# Patient Record
Sex: Male | Born: 1970
Health system: Southern US, Community
[De-identification: ages and names within clinical notes are randomized; demographics above are authoritative.]

## PROBLEM LIST (undated history)

## (undated) DIAGNOSIS — S2231XA Fracture of one rib, right side, initial encounter for closed fracture: Secondary | ICD-10-CM

## (undated) DIAGNOSIS — F419 Anxiety disorder, unspecified: Secondary | ICD-10-CM

## (undated) DIAGNOSIS — G4700 Insomnia, unspecified: Secondary | ICD-10-CM

## (undated) DIAGNOSIS — J309 Allergic rhinitis, unspecified: Secondary | ICD-10-CM

## (undated) DIAGNOSIS — M5430 Sciatica, unspecified side: Secondary | ICD-10-CM

## (undated) DIAGNOSIS — S2241XA Multiple fractures of ribs, right side, initial encounter for closed fracture: Secondary | ICD-10-CM

## (undated) HISTORY — DX: Anxiety disorder, unspecified: F41.9

## (undated) HISTORY — DX: Allergic rhinitis, unspecified: J30.9

## (undated) HISTORY — DX: Insomnia, unspecified: G47.00

## (undated) HISTORY — DX: Multiple fractures of ribs, right side, initial encounter for closed fracture: S22.41XA

## (undated) HISTORY — DX: Fracture of one rib, right side, initial encounter for closed fracture: S22.31XA

## (undated) HISTORY — DX: Sciatica, unspecified side: M54.30

---

## 2007-10-26 DIAGNOSIS — G44209 Tension-type headache, unspecified, not intractable: Secondary | ICD-10-CM | POA: Insufficient documentation

## 2009-01-09 ENCOUNTER — Ambulatory Visit: Payer: Self-pay | Admitting: Family Medicine

## 2009-11-07 ENCOUNTER — Ambulatory Visit: Payer: Self-pay | Admitting: Family Medicine

## 2012-09-24 ENCOUNTER — Ambulatory Visit: Payer: Self-pay | Admitting: Family Medicine

## 2013-01-08 ENCOUNTER — Ambulatory Visit: Payer: Self-pay | Admitting: Unknown Physician Specialty

## 2013-01-08 LAB — HM COLONOSCOPY

## 2013-10-10 ENCOUNTER — Emergency Department: Payer: Self-pay | Admitting: Emergency Medicine

## 2014-05-05 ENCOUNTER — Ambulatory Visit: Payer: Self-pay | Admitting: Family Medicine

## 2014-06-05 ENCOUNTER — Emergency Department: Payer: Self-pay | Admitting: Emergency Medicine

## 2014-06-07 ENCOUNTER — Ambulatory Visit: Payer: Self-pay | Admitting: Family Medicine

## 2014-10-14 ENCOUNTER — Encounter: Payer: Self-pay | Admitting: Family Medicine

## 2014-10-14 ENCOUNTER — Encounter: Payer: Self-pay | Admitting: *Deleted

## 2014-10-14 ENCOUNTER — Ambulatory Visit (INDEPENDENT_AMBULATORY_CARE_PROVIDER_SITE_OTHER): Payer: PRIVATE HEALTH INSURANCE | Admitting: Family Medicine

## 2014-10-14 VITALS — BP 142/70 | HR 96 | Temp 98.4°F | Resp 16 | Ht 72.0 in | Wt 206.0 lb

## 2014-10-14 DIAGNOSIS — G8929 Other chronic pain: Secondary | ICD-10-CM | POA: Insufficient documentation

## 2014-10-14 DIAGNOSIS — S2241XA Multiple fractures of ribs, right side, initial encounter for closed fracture: Secondary | ICD-10-CM

## 2014-10-14 DIAGNOSIS — M79671 Pain in right foot: Secondary | ICD-10-CM | POA: Insufficient documentation

## 2014-10-14 DIAGNOSIS — K529 Noninfective gastroenteritis and colitis, unspecified: Secondary | ICD-10-CM | POA: Insufficient documentation

## 2014-10-14 DIAGNOSIS — S2231XA Fracture of one rib, right side, initial encounter for closed fracture: Secondary | ICD-10-CM | POA: Insufficient documentation

## 2014-10-14 DIAGNOSIS — H6690 Otitis media, unspecified, unspecified ear: Secondary | ICD-10-CM | POA: Insufficient documentation

## 2014-10-14 DIAGNOSIS — H6692 Otitis media, unspecified, left ear: Secondary | ICD-10-CM | POA: Diagnosis not present

## 2014-10-14 DIAGNOSIS — M5430 Sciatica, unspecified side: Secondary | ICD-10-CM | POA: Insufficient documentation

## 2014-10-14 DIAGNOSIS — R0781 Pleurodynia: Secondary | ICD-10-CM | POA: Insufficient documentation

## 2014-10-14 DIAGNOSIS — M25559 Pain in unspecified hip: Secondary | ICD-10-CM

## 2014-10-14 HISTORY — DX: Multiple fractures of ribs, right side, initial encounter for closed fracture: S22.41XA

## 2014-10-14 HISTORY — DX: Sciatica, unspecified side: M54.30

## 2014-10-14 HISTORY — DX: Fracture of one rib, right side, initial encounter for closed fracture: S22.31XA

## 2014-10-14 MED ORDER — AMOXICILLIN 500 MG PO CAPS: 1000.0000 mg | ORAL_CAPSULE | Freq: Two times a day (BID) | ORAL | Status: AC

## 2014-10-14 MED ORDER — VALACYCLOVIR HCL 1 G PO TABS: 1000.0000 mg | ORAL_TABLET | Freq: Every day | ORAL | Status: DC

## 2014-10-14 NOTE — Progress Notes (Signed)
   Patient: Patrick Lucas Male    DOB: 1970/09/19   44 y.o.   MRN: [id number] Visit Date: 10/14/2014  Today's Provider: Mila Merry, MD   No chief complaint on file.  Subjective:    Headache  Associated symptoms include ear pain.  Otalgia  There is pain in both ears. Associated symptoms include headaches.       Previous Medications   ETODOLAC (LODINE) 500 MG TABLET    TAKE 1 TABLET (500 MG TOTAL) BY MOUTH 2 (TWO) TIMES DAILY.   LORAZEPAM (ATIVAN) 1 MG TABLET    Take by mouth.   TRAZODONE (DESYREL) 50 MG TABLET    TAKE 1-2 TABLET BY MOUTH AT BEDTIME AS NEEDED FOR INSOMNIA   VALACYCLOVIR (VALTREX) 1000 MG TABLET    Take by mouth.    Review of Systems  HENT: Positive for ear pain.   Neurological: Positive for headaches.    History  Substance Use Topics  . Smoking status: Current Every Day Smoker -- 1.00 packs/day for 30 years    Types: Cigarettes    Start date: 10/13/1984  . Smokeless tobacco: Not on file  . Alcohol Use: No   Objective:   There were no vitals taken for this visit.  Physical Exam      Assessment & Plan:     1. Acute left otitis media, recurrence not specified, unspecified otitis media type Amoxicillin 500 2 twice a day for 10 days.

## 2014-10-14 NOTE — Progress Notes (Signed)
Subjective:     Patient ID: Patrick Lucas, male   DOB: 1971/01/03, 44 y.o.   MRN: [id number]  Headache  This is a new problem. The current episode started in the past 7 days. The problem occurs constantly. The problem has been gradually worsening. The pain radiates to the right neck. The quality of the pain is described as aching. The pain is at a severity of 4/10. The pain is mild. Associated symptoms include ear pain and neck pain. Pertinent negatives include no coughing, fever, sinus pressure or sore throat. He has tried acetaminophen and NSAIDs for the symptoms. The treatment provided no relief. There is no history of migraine headaches.  Otalgia  There is pain in both (Left more than the right side) ears. This is a new problem. The current episode started in the past 7 days. The problem occurs constantly. The problem has been gradually worsening. There has been no fever. The pain is at a severity of 4/10. The pain is mild. Associated symptoms include headaches and neck pain. Pertinent negatives include no coughing or sore throat. He has tried nothing for the symptoms.     Review of Systems  Constitutional: Negative.  Negative for fever.  HENT: Positive for ear pain. Negative for sinus pressure and sore throat.   Eyes: Negative.   Respiratory: Negative for cough.   Musculoskeletal: Positive for neck pain.  Neurological: Positive for headaches.       Objective:   Physical Exam BP 142/70 mmHg  Pulse 96  Temp(Src) 98.4 F (36.9 C) (Oral)  Resp 16  Ht 6' (1.829 m)  Wt 206 lb (93.441 kg)  BMI 27.93 kg/m2  SpO2 98%   General Appearance:    Alert, cooperative, no distress  Eyes:    PERRL, conjunctiva/corneas clear, EOM's intact       Lungs:     Clear to auscultation bilaterally, respirations unlabored  Heart:    Regular rate and rhythm  Neurologic:   Awake, alert, oriented x 3. No apparent focal neurological           defect.   ENT:   Left TM inflamed. Mild OP clear drainage. Post  auricular LAD

## 2014-10-18 ENCOUNTER — Encounter: Payer: Self-pay | Admitting: Family Medicine

## 2014-11-21 ENCOUNTER — Encounter: Payer: Self-pay | Admitting: Family Medicine

## 2014-11-21 ENCOUNTER — Ambulatory Visit (INDEPENDENT_AMBULATORY_CARE_PROVIDER_SITE_OTHER): Payer: PRIVATE HEALTH INSURANCE | Admitting: Family Medicine

## 2014-11-21 ENCOUNTER — Ambulatory Visit
Admission: RE | Admit: 2014-11-21 | Discharge: 2014-11-21 | Disposition: A | Discharge status: Home / Self Care | Payer: 59 | Source: Ambulatory Visit | Attending: Family Medicine | Admitting: Family Medicine

## 2014-11-21 VITALS — BP 120/92 | HR 100 | Temp 97.7°F | Resp 16 | Wt 204.8 lb

## 2014-11-21 DIAGNOSIS — S62603A Fracture of unspecified phalanx of left middle finger, initial encounter for closed fracture: Secondary | ICD-10-CM | POA: Diagnosis not present

## 2014-11-21 DIAGNOSIS — X58XXXA Exposure to other specified factors, initial encounter: Secondary | ICD-10-CM | POA: Diagnosis not present

## 2014-11-21 DIAGNOSIS — M25561 Pain in right knee: Secondary | ICD-10-CM

## 2014-11-21 DIAGNOSIS — M79645 Pain in left finger(s): Secondary | ICD-10-CM | POA: Diagnosis not present

## 2014-11-21 DIAGNOSIS — T1490XA Injury, unspecified, initial encounter: Principal | ICD-10-CM

## 2014-11-21 DIAGNOSIS — M25461 Effusion, right knee: Secondary | ICD-10-CM | POA: Diagnosis not present

## 2014-11-21 MED ORDER — IBUPROFEN 800 MG PO TABS: 800.0000 mg | ORAL_TABLET | Freq: Three times a day (TID) | ORAL | Status: DC | PRN

## 2014-11-21 NOTE — Progress Notes (Signed)
Patient ID: Patrick Lucas, male   DOB: Apr 30, 1970, 44 y.o.   MRN: [id number]       Patient: Patrick Lucas Male    DOB: Sep 04, 1970   44 y.o.   MRN: [id number] Visit Date: 11/21/2014  Today's Provider: Dortha Kern, PA   Chief Complaint  Patient presents with  . Knee Pain    right knee, fell on Saturday evening, 11/19/2014, took some ibuprofen yesterday.  . Hand Pain    left hand swollen   Subjective:    HPI  This 44 year old male tripped over his Rotweiler dog and fell. Got tangled up in a chair as he fell and now having pain in the right knee Saturday 11-19-14. Notice swelling of the left ring finger today and had to cut his ring off.   Past Medical History  Diagnosis Date  . Anxiety   . Insomnia   . Allergic rhinitis   . Fracture of multiple ribs, right, closed. initial encounter 10/14/2014  . Fracture of one rib of right side, sequela 10/14/2014  . Sciatica 10/14/2014   No past surgical history on file. No family history on file.   No Known Allergies   Previous Medications   LORAZEPAM (ATIVAN) 1 MG TABLET    Take by mouth.   TRAZODONE (DESYREL) 50 MG TABLET    TAKE 1-2 TABLET BY MOUTH AT BEDTIME AS NEEDED FOR INSOMNIA   VALACYCLOVIR (VALTREX) 1000 MG TABLET    Take 1 tablet (1,000 mg total) by mouth daily.    Review of Systems  Constitutional: Negative.   HENT: Negative.   Respiratory: Negative.   Cardiovascular: Negative.   Gastrointestinal: Negative.   Musculoskeletal: Positive for arthralgias.       Right knee pain to cross legs or climb stairs. Pain in left ring finger with swelling.  Neurological: Negative.     History  Substance Use Topics  . Smoking status: Current Every Day Smoker -- 1.00 packs/day for 30 years    Types: Cigarettes    Start date: 10/13/1984  . Smokeless tobacco: Not on file  . Alcohol Use: No   Objective:   BP 120/92 mmHg  Pulse 100  Temp(Src) 97.7 F (36.5 C) (Oral)  Resp 16  Wt 204 lb 12.8 oz (92.897 kg)  Physical Exam    Constitutional: He is oriented to person, place, and time. He appears well-developed and well-nourished. No distress.  HENT:  Head: Normocephalic and atraumatic.  Right Ear: Hearing normal.  Left Ear: Hearing normal.  Nose: Nose normal.  Eyes: Conjunctivae and lids are normal. Right eye exhibits no discharge. Left eye exhibits no discharge. No scleral icterus.  Pulmonary/Chest: Effort normal. No respiratory distress.  Musculoskeletal: Normal range of motion. He exhibits tenderness.  Right medial knee. Worse to stress medial collateral ligament. Negative Drawer Sign. No click or locking. Swelling and tenderness of the left PIP joint with stiffness to test ROM.  Neurological: He is alert and oriented to person, place, and time.  Skin: Skin is intact. No lesion and no rash noted.  Psychiatric: He has a normal mood and affect. His speech is normal and behavior is normal. Thought content normal.      Assessment & Plan:     1. Pain of right knee after injury Onset 2 days ago when he tripped over a dog and fell onto a chair. Pain sharp with climbing stairs and putting medial stress on the joint. Will get x-ray evaluation and treat with knee brace and  Ibuprofen 800 mg TID. May continue ice pack prn. Recheck pernding - DG Knee Complete 4 Views Right  2. Pain in finger of left hand Onset with fall Saturday. Swelling of the PIP joint of the left ring finger. Will get x-ray evaluation and recheck pending report. - DG Hand Complete Left       Dortha Kern, PA  [facility]

## 2014-12-22 ENCOUNTER — Ambulatory Visit (INDEPENDENT_AMBULATORY_CARE_PROVIDER_SITE_OTHER): Payer: PRIVATE HEALTH INSURANCE | Admitting: Family Medicine

## 2014-12-22 ENCOUNTER — Encounter: Payer: Self-pay | Admitting: Family Medicine

## 2014-12-22 VITALS — BP 94/68 | HR 96 | Temp 98.0°F | Resp 16 | Wt 205.0 lb

## 2014-12-22 DIAGNOSIS — K529 Noninfective gastroenteritis and colitis, unspecified: Secondary | ICD-10-CM

## 2014-12-22 MED ORDER — PROMETHAZINE HCL 25 MG PO TABS: 25.0000 mg | ORAL_TABLET | Freq: Three times a day (TID) | ORAL | Status: DC | PRN

## 2014-12-22 NOTE — Progress Notes (Signed)
       Patient: Patrick Lucas Male    ESIAH BAZINET 1972   44 y.o.   MRN: [id number] Visit Date: 12/22/2014  Today's Provider: Mila Merry, MD   Chief Complaint  Patient presents with  . Diarrhea  . Nausea  . Emesis   Subjective:    Diarrhea  The current episode started yesterday. The problem occurs more than 10 times per day. The problem has been gradually improving. The patient states that diarrhea awakens him from sleep. Associated symptoms include abdominal pain, chills, a fever, sweats and vomiting. Pertinent negatives include no bloating, headaches or increased  flatus. Nothing aggravates the symptoms. Treatments tried: phenergan. The treatment provided mild relief.  Missed work last night and needs a work excuse. He has been drinking water today with no vomiting, although he still feels somewhat nauseated. No blood in stool.     No Known Allergies Previous Medications   ACETAMINOPHEN (TYLENOL) 500 MG TABLET    Take 500 mg by mouth every 6 (six) hours as needed.   IBUPROFEN (ADVIL,MOTRIN) 800 MG TABLET    Take 1 tablet (800 mg total) by mouth every 8 (eight) hours as needed.   LORAZEPAM (ATIVAN) 1 MG TABLET    Take by mouth.   TRAZODONE (DESYREL) 50 MG TABLET    TAKE 1-2 TABLET BY MOUTH AT BEDTIME AS NEEDED FOR INSOMNIA   VALACYCLOVIR (VALTREX) 1000 MG TABLET    Take 1 tablet (1,000 mg total) by mouth daily.    Review of Systems  Constitutional: Positive for fever and chills.  Cardiovascular: Negative for chest pain and palpitations.  Gastrointestinal: Positive for vomiting, abdominal pain and diarrhea. Negative for bloating and flatus.  Neurological: Negative for dizziness, light-headedness and headaches.    Social History  Substance Use Topics  . Smoking status: Current Every Day Smoker -- 1.00 packs/day for 30 years    Types: Cigarettes    Start date: 10/13/1984  . Smokeless tobacco: Not on file  . Alcohol Use: No   Objective:   BP 94/68 mmHg  Pulse 96   Temp(Src) 98 F (36.7 C) (Oral)  Resp 16  Wt 205 lb (92.987 kg)  SpO2 97%  Physical Exam  General Appearance:    Alert, cooperative, no distress  Eyes:    PERRL, conjunctiva/corneas clear, EOM's intact       Lungs:     Clear to auscultation bilaterally, respirations unlabored  Heart:    Regular rate and rhythm  Abdomen:   bowel sounds present and hypoactive in all 4 quadrants, soft, round or nondistended. No CVA tenderness,           Assessment & Plan:           Mila Merry, MD  [facility] [phone]

## 2014-12-22 NOTE — Patient Instructions (Addendum)

## 2015-01-18 ENCOUNTER — Ambulatory Visit (INDEPENDENT_AMBULATORY_CARE_PROVIDER_SITE_OTHER): Payer: PRIVATE HEALTH INSURANCE | Admitting: Family Medicine

## 2015-01-18 ENCOUNTER — Encounter: Payer: Self-pay | Admitting: Family Medicine

## 2015-01-18 VITALS — BP 104/70 | HR 118 | Temp 98.1°F | Resp 20 | Ht 72.0 in | Wt 204.0 lb

## 2015-01-18 DIAGNOSIS — M545 Low back pain, unspecified: Secondary | ICD-10-CM

## 2015-01-18 MED ORDER — CYCLOBENZAPRINE HCL 5 MG PO TABS: 5.0000 mg | ORAL_TABLET | Freq: Three times a day (TID) | ORAL | Status: DC | PRN

## 2015-01-18 NOTE — Patient Instructions (Signed)
Back Pain, Adult Low back pain is very common. About 1 in 5 people have back pain.The cause of low back pain is rarely dangerous. The pain often gets better over time.About half of people with a sudden onset of back pain feel better in just 2 weeks. About 8 in 10 people feel better by 6 weeks.  CAUSES Some common causes of back pain include:  Strain of the muscles or ligaments supporting the spine.  Wear and tear (degeneration) of the spinal discs.  Arthritis.  Direct injury to the back. DIAGNOSIS Most of the time, the direct cause of low back pain is not known.However, back pain can be treated effectively even when the exact cause of the pain is unknown.Answering your caregiver's questions about your overall health and symptoms is one of the most accurate ways to make sure the cause of your pain is not dangerous. If your caregiver needs more information, he or she may order lab work or imaging tests (X-rays or MRIs).However, even if imaging tests show changes in your back, this usually does not require surgery. HOME CARE INSTRUCTIONS For many people, back pain returns.Since low back pain is rarely dangerous, it is often a condition that people can learn to manageon their own.   Remain active. It is stressful on the back to sit or stand in one place. Do not sit, drive, or stand in one place for more than 30 minutes at a time. Take short walks on level surfaces as soon as pain allows.Try to increase the length of time you walk each day.  Do not stay in bed.Resting more than 1 or 2 days can delay your recovery.  Do not avoid exercise or work.Your body is made to move.It is not dangerous to be active, even though your back may hurt.Your back will likely heal faster if you return to being active before your pain is gone.  Pay attention to your body when you bend and lift. Many people have less discomfortwhen lifting if they bend their knees, keep the load close to their bodies,and  avoid twisting. Often, the most comfortable positions are those that put less stress on your recovering back.  Find a comfortable position to sleep. Use a firm mattress and lie on your side with your knees slightly bent. If you lie on your back, put a pillow under your knees.  Only take over-the-counter or prescription medicines as directed by your caregiver. Over-the-counter medicines to reduce pain and inflammation are often the most helpful.Your caregiver may prescribe muscle relaxant drugs.These medicines help dull your pain so you can more quickly return to your normal activities and healthy exercise.  Put ice on the injured area.  Put ice in a plastic bag.  Place a towel between your skin and the bag.  Leave the ice on for 15-20 minutes, 03-04 times a day for the first 2 to 3 days. After that, ice and heat may be alternated to reduce pain and spasms.  Ask your caregiver about trying back exercises and gentle massage. This may be of some benefit.  Avoid feeling anxious or stressed.Stress increases muscle tension and can worsen back pain.It is important to recognize when you are anxious or stressed and learn ways to manage it.Exercise is a great option. SEEK MEDICAL CARE IF:  You have pain that is not relieved with rest or medicine.  You have pain that does not improve in 1 week.  You have new symptoms.  You are generally not feeling well. SEEK   IMMEDIATE MEDICAL CARE IF:   You have pain that radiates from your back into your legs.  You develop new bowel or bladder control problems.  You have unusual weakness or numbness in your arms or legs.  You develop nausea or vomiting.  You develop abdominal pain.  You feel faint. Document Released: 04/15/2005 Document Revised: 10/15/2011 Document Reviewed: 08/17/2013 ExitCare Patient Information 2015 [employer]. This information is not intended to replace advice given to you by your health care provider. Make sure you  discuss any questions you have with your health care provider.  

## 2015-01-18 NOTE — Progress Notes (Signed)
Patient: Patrick Lucas Male    DOB: 06-17-1970   44 y.o.   MRN: [id number] Visit Date: 01/18/2015  Today's Provider: Mila Merry, MD   Chief Complaint  Patient presents with  . Back Pain   Subjective:    Back Pain This is a new problem. Episode onset: 2 days ago. The problem occurs constantly. The problem is unchanged. The pain is present in the lumbar spine (left side). Quality: sharp. The pain does not radiate. The pain is at a severity of 4/10. The symptoms are aggravated by sitting. Pertinent negatives include no abdominal pain, bladder incontinence, bowel incontinence, chest pain, dysuria, fever, headaches, leg pain, numbness, paresis, paresthesias, pelvic pain, perianal numbness, tingling, weakness or weight loss. He has tried NSAIDs for the symptoms. The treatment provided no relief.  Patient states pain started 2 days ago immediately after lifting a 5 Gallon gas jug out from the back of his truck. No radiation. Has not been able to return to work as he has to do a lot of lifting of objects over 20 pounds.     No Known Allergies Previous Medications   ACETAMINOPHEN (TYLENOL) 500 MG TABLET    Take 500 mg by mouth every 6 (six) hours as needed.   IBUPROFEN (ADVIL,MOTRIN) 800 MG TABLET    Take 1 tablet (800 mg total) by mouth every 8 (eight) hours as needed.   LORAZEPAM (ATIVAN) 1 MG TABLET    Take by mouth.   PROMETHAZINE (PHENERGAN) 25 MG TABLET    Take 1 tablet (25 mg total) by mouth every 8 (eight) hours as needed for nausea.   TRAZODONE (DESYREL) 50 MG TABLET    TAKE 1-2 TABLET BY MOUTH AT BEDTIME AS NEEDED FOR INSOMNIA   VALACYCLOVIR (VALTREX) 1000 MG TABLET    Take 1 tablet (1,000 mg total) by mouth daily.    Review of Systems  Constitutional: Negative for fever and weight loss.  Cardiovascular: Negative for chest pain.  Gastrointestinal: Negative for abdominal pain and bowel incontinence.  Genitourinary: Negative for bladder incontinence, dysuria and pelvic pain.   Musculoskeletal: Positive for back pain.  Neurological: Negative for tingling, weakness, numbness, headaches and paresthesias.    Social History  Substance Use Topics  . Smoking status: Current Every Day Smoker -- 1.00 packs/day for 30 years    Types: Cigarettes    Start date: 10/13/1984  . Smokeless tobacco: Not on file  . Alcohol Use: No   Objective:   BP 104/70 mmHg  Pulse 118  Temp(Src) 98.1 F (36.7 C) (Oral)  Resp 20  Ht 6' (1.829 m)  Wt 204 lb (92.534 kg)  BMI 27.66 kg/m2  SpO2 94%  Physical Exam   General Appearance:    Alert, cooperative, no distress  Eyes:    PERRL, conjunctiva/corneas clear, EOM's intact       Lungs:     Clear to auscultation bilaterally, respirations unlabored  Heart:    Regular rate and rhythm  Neurologic:   Awake, alert, oriented x 3. No apparent focal neurological           defect.  Normal strength, tone and s/s of bilateral LEs.   MS:  Tender left lower para spinous muscles. No spine tenderness. Mild swelling of left paralumbar muscles.        Assessment & Plan:     1. Left-sided low back pain without sciatica Apply ice pack 8-10 minutes every four hours for the next day, may then  change to heat pads.  May continue OTC Aleve during the day - cyclobenzaprine (FLEXERIL) 5 MG tablet; Take 1-2 tablets (5-10 mg total) by mouth 3 (three) times daily as needed (for back pain).  Dispense: 30 tablet; Refill: 1  .Work excuse to cover from 01-16-15 through 01-22-2015.       Mila Merry, MD  [facility]

## 2015-02-08 ENCOUNTER — Telehealth: Payer: Self-pay | Admitting: Family Medicine

## 2015-02-08 NOTE — Telephone Encounter (Signed)
Pt is requesting a a letter stating he is able to drive with the medications he is taking.  Pt states he trying to get his licence back and his attorney and ask for pt to get this letter.  Pt will pick this up when ready.  ZO#[phone]/WJCB#[phone]/MW

## 2015-02-08 NOTE — Telephone Encounter (Signed)
Please advise letter stating that patient is able to drive while taking his current medications?

## 2015-02-09 NOTE — Telephone Encounter (Signed)
Patient stated that he is talking about the cyclobenzaprine and the lorazepam. Patient stated that he takes these meds at night.

## 2015-02-09 NOTE — Telephone Encounter (Signed)
Which medication is he talking about. He should not drive when he takes cyclobenzaprine or lorazepam.

## 2015-02-20 ENCOUNTER — Other Ambulatory Visit: Payer: Self-pay | Admitting: Family Medicine

## 2015-02-20 NOTE — Telephone Encounter (Signed)
Pt called wanting 90 day refill on the traZODone (DESYREL) 50 MG tablet And LORazepam (ATIVAN) 1 MG tablet  [facility] [place]  Call back is [phone]  Thanks Barth Kirkseri

## 2015-02-21 MED ORDER — TRAZODONE HCL 50 MG PO TABS: 50.0000 mg | ORAL_TABLET | Freq: Every evening | ORAL | Status: DC | PRN

## 2015-02-21 MED ORDER — LORAZEPAM 1 MG PO TABS: 1.0000 mg | ORAL_TABLET | Freq: Every evening | ORAL | Status: DC | PRN

## 2015-02-21 NOTE — Telephone Encounter (Signed)
Rx called in to pharmacy. 

## 2015-02-21 NOTE — Telephone Encounter (Signed)
Please call in lorazepam.  

## 2015-03-10 ENCOUNTER — Ambulatory Visit (INDEPENDENT_AMBULATORY_CARE_PROVIDER_SITE_OTHER): Payer: PRIVATE HEALTH INSURANCE | Admitting: Family Medicine

## 2015-03-10 ENCOUNTER — Encounter: Payer: Self-pay | Admitting: Family Medicine

## 2015-03-10 VITALS — BP 130/78 | HR 84 | Temp 97.6°F | Resp 16 | Ht 72.0 in | Wt 203.0 lb

## 2015-03-10 DIAGNOSIS — J069 Acute upper respiratory infection, unspecified: Secondary | ICD-10-CM | POA: Diagnosis not present

## 2015-03-10 MED ORDER — HYDROCODONE-HOMATROPINE 5-1.5 MG/5ML PO SYRP: ORAL_SOLUTION | ORAL | Status: DC

## 2015-03-10 NOTE — Patient Instructions (Signed)
Discussed use of Mucinex D for congestion, Delsym for cough, and Benadryl for postnasal drainage 

## 2015-03-10 NOTE — Progress Notes (Signed)
Subjective:     Patient ID: Patrick Lucas, male   DOB: 03/30/1971, 44 y.o.   MRN: [id number]  HPI  Chief Complaint  Patient presents with  . Cough    Patient comes in office today with concerns of cough and congestion >2 days he reports taking otc: Nightquil  Reports cold sx onset two days ago with chills but no fever. Has reduced his smoking while ill.   Review of Systems     Objective:   Physical Exam  Constitutional: He appears well-developed and well-nourished. No distress.       Assessment:    Ears: T.M's intact without inflammation Throat: no tonsillar enlargement or exudate Neck: no cervical adenopathy Lungs: clear     Plan:    Discussed use of Mucinex D for congestion, Delsym for cough, and Benadryl for postnasal drainage

## 2015-03-30 ENCOUNTER — Encounter: Payer: Self-pay | Admitting: Family Medicine

## 2015-03-30 ENCOUNTER — Ambulatory Visit (INDEPENDENT_AMBULATORY_CARE_PROVIDER_SITE_OTHER): Payer: PRIVATE HEALTH INSURANCE | Admitting: Family Medicine

## 2015-03-30 VITALS — BP 122/80 | HR 95 | Temp 97.7°F | Resp 16 | Wt 204.0 lb

## 2015-03-30 DIAGNOSIS — F329 Major depressive disorder, single episode, unspecified: Secondary | ICD-10-CM

## 2015-03-30 DIAGNOSIS — F418 Other specified anxiety disorders: Secondary | ICD-10-CM

## 2015-03-30 DIAGNOSIS — F32A Depression, unspecified: Secondary | ICD-10-CM

## 2015-03-30 MED ORDER — ESCITALOPRAM OXALATE 10 MG PO TABS: ORAL_TABLET | ORAL | Status: DC

## 2015-03-30 MED ORDER — ALPRAZOLAM 0.5 MG PO TABS: 0.2500 mg | ORAL_TABLET | ORAL | Status: DC | PRN

## 2015-03-30 NOTE — Progress Notes (Signed)
       Patient: Patrick Lucas Male    DOB: 05/01/1970   44 y.o.   MRN: [id number] Visit Date: 03/30/2015  Today's Provider: Mila Merryonald Fisher, MD   Chief Complaint  Patient presents with  . Stress   Subjective:    HPI Stress/ Anxiety/ Depression evaluation: Patient comes in today stating he has been under a lot of stress in the past week. Patient states he has confrontations at work that may be the cause of his stress. He is also having stress at home. He feels depressed and he doesn't feel like doing anything anymore. Patient has a past history of Anxiety and takes Lorazepam at night to help him sleep.     No Known Allergies Previous Medications   LORAZEPAM (ATIVAN) 1 MG TABLET    Take 1-2 tablets (1-2 mg total) by mouth at bedtime as needed for anxiety.   TRAZODONE (DESYREL) 50 MG TABLET    Take 1-2 tablets (50-100 mg total) by mouth at bedtime as needed for sleep.   VALACYCLOVIR (VALTREX) 1000 MG TABLET    Take 1 tablet (1,000 mg total) by mouth daily.    Review of Systems  Constitutional: Negative for fever, chills and appetite change.  Respiratory: Negative for chest tightness, shortness of breath and wheezing.   Cardiovascular: Negative for chest pain and palpitations.  Gastrointestinal: Negative for nausea, vomiting and abdominal pain.  Psychiatric/Behavioral: Positive for hallucinations, dysphoric mood and agitation. Negative for suicidal ideas, confusion, sleep disturbance, self-injury and decreased concentration. The patient is nervous/anxious.     Social History  Substance Use Topics  . Smoking status: Current Every Day Smoker -- 1.00 packs/day for 30 years    Types: Cigarettes    Start date: 10/13/1984  . Smokeless tobacco: Not on file  . Alcohol Use: No   Objective:   BP 122/80 mmHg  Pulse 95  Temp(Src) 97.7 F (36.5 C) (Oral)  Resp 16  Wt 204 lb (92.534 kg)  SpO2 97%  Physical Exam  General appearance: alert, well developed, well nourished, cooperative  and in no distress Head: Normocephalic, without obvious abnormality, atraumatic Lungs: Respirations even and unlabored Extremities: No gross deformities Skin: Skin color, texture, turgor normal. No rashes seen  Psych: Appropriate mood and affect. Neurologic: Mental status: Alert, oriented to person, place, and time, thought content appropriate.     Assessment & Plan:     1. Other specified anxiety disorders Start alprazolam to take as needed during day, not to mix with lorazapam hs.  - ALPRAZolam (XANAX) 0.5 MG tablet; Take 0.5-1 tablets (0.25-0.5 mg total) by mouth every 4 (four) hours as needed for anxiety.  Dispense: 30 tablet; Refill: 1 - escitalopram (LEXAPRO) 10 MG tablet; 1/2 tablet daily for 6 days, then increase to 1 tablet daily for 6 days, then increase to 2 daily  Dispense: 30 tablet; Refill: 0  2. Depression Start SSRI - escitalopram (LEXAPRO) 10 MG tablet; 1/2 tablet daily for 6 days, then increase to 1 tablet daily for 6 days, then increase to 2 daily  Dispense: 30 tablet; Refill: 0  Call if symptoms change or if not rapidly improving.   Follow up 2-3 weeks.         Mila Merryonald Fisher, MD  [facility]

## 2015-04-19 ENCOUNTER — Ambulatory Visit: Payer: PRIVATE HEALTH INSURANCE | Admitting: Family Medicine

## 2015-04-28 IMAGING — CR LEFT RIBS AND CHEST - 3+ VIEW
1 series · 5 of 5 positions shown · non-contrast
Comparison: None.

CLINICAL DATA: Left anterior rib pain status post fall yesterday.

EXAM:
LEFT RIBS AND CHEST - 3+ VIEW

[Series 1: kdxr ribs left unilateral · 0.14mm/px · 5 of 5 slices shown]
[im 1/5]
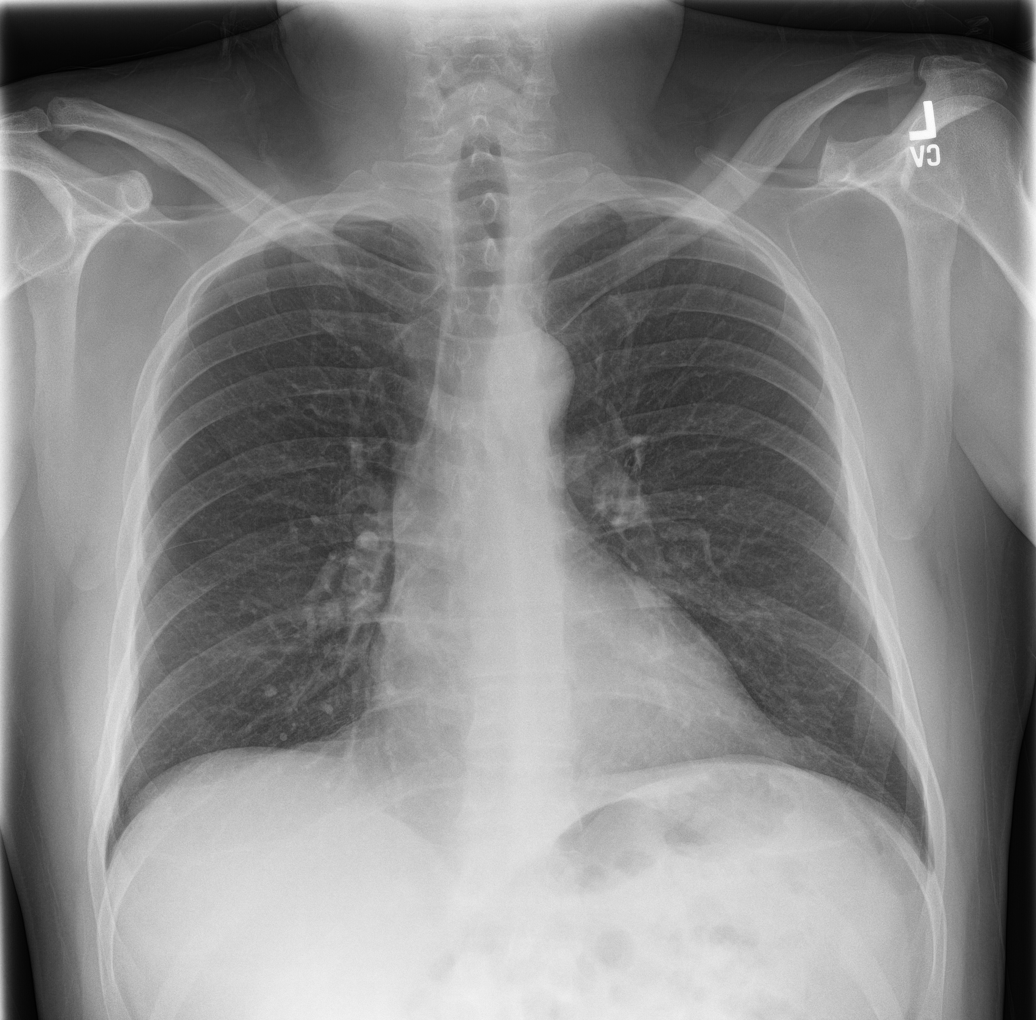
[im 2/5]
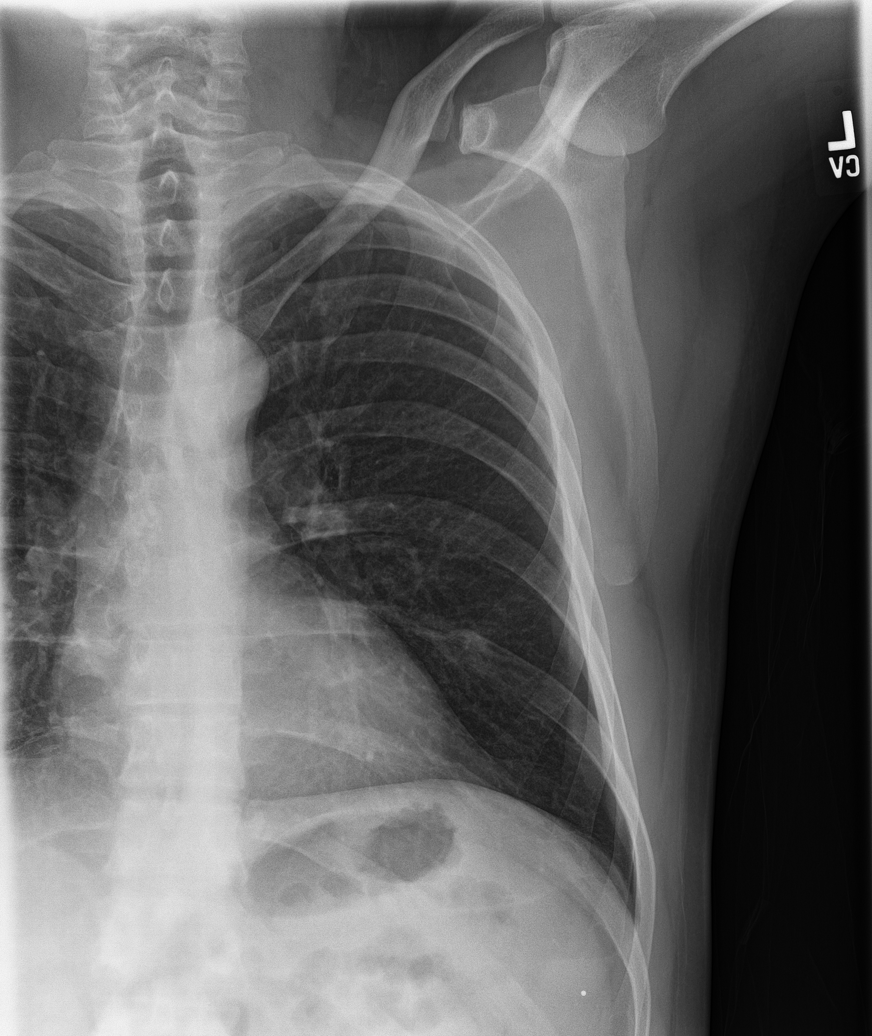
[im 3/5]
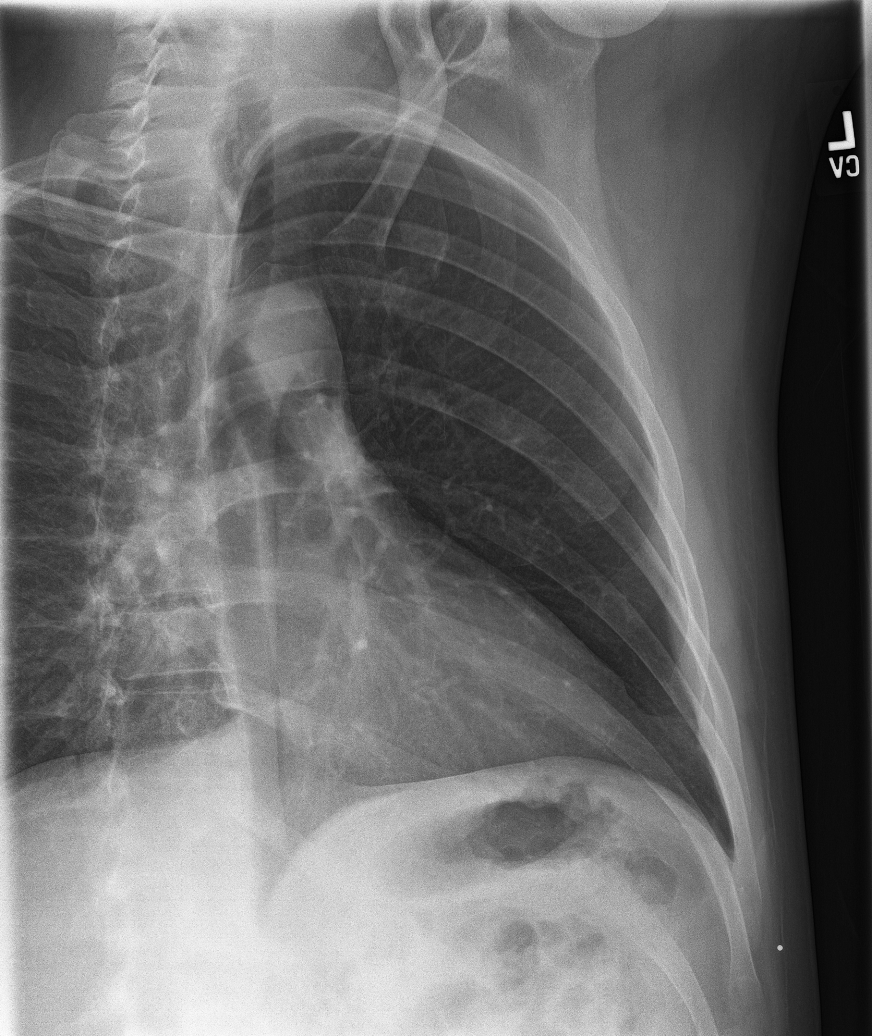
[im 4/5]
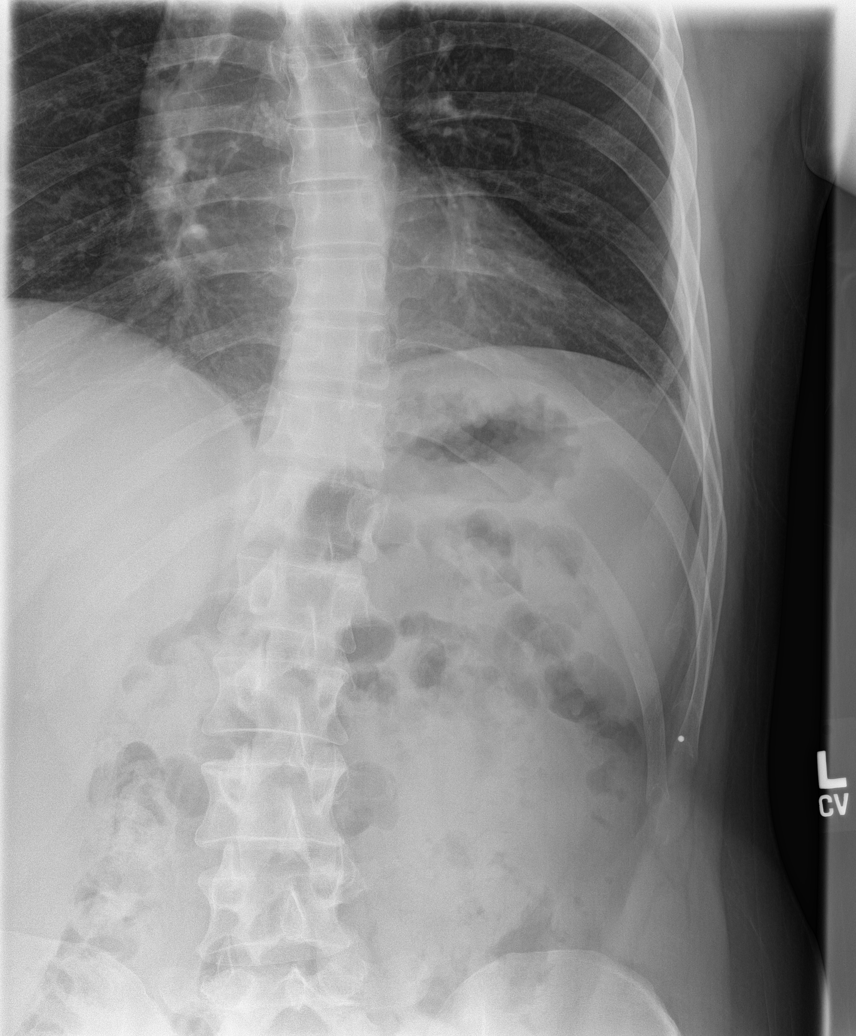
[im 5/5]
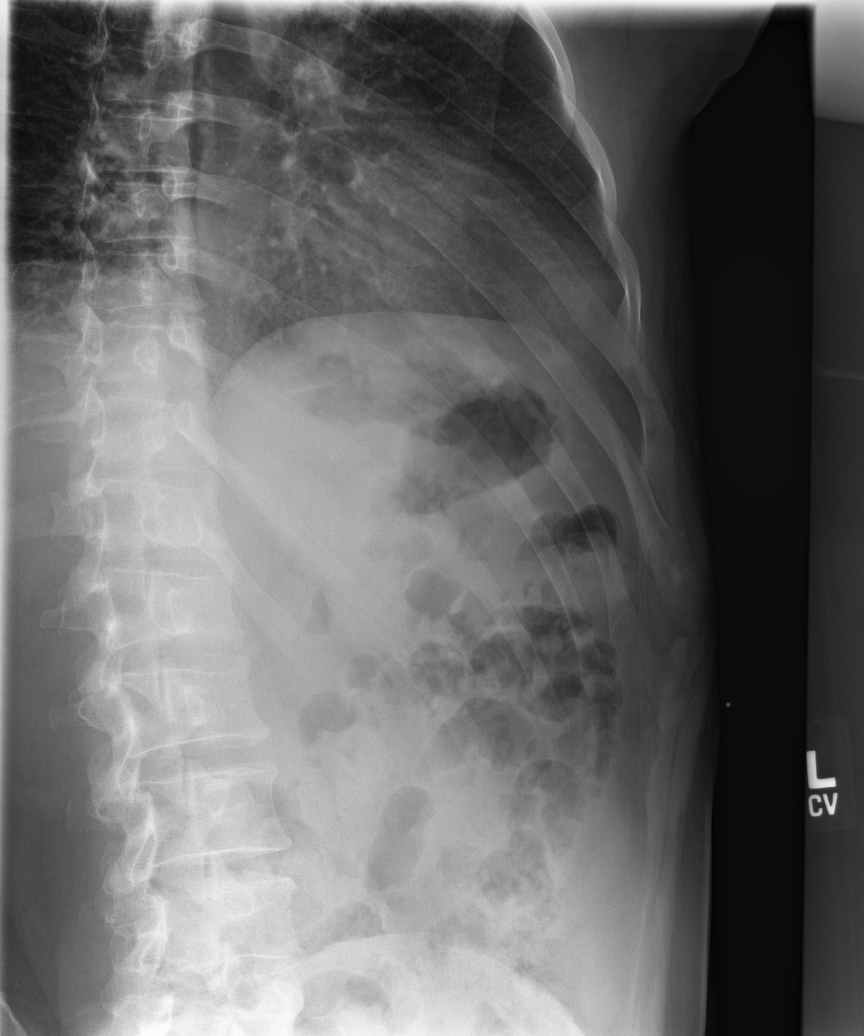

[5 of 5 positions shown; findings below may reference images not displayed]

FINDINGS: No fracture or other bone lesions are seen involving the ribs. There
is no evidence of pneumothorax or pleural effusion. Both lungs are
clear. Heart size and mediastinal contours are within normal limits.
IMPRESSION: Negative.

## 2015-05-31 ENCOUNTER — Other Ambulatory Visit: Payer: Self-pay | Admitting: Family Medicine

## 2015-05-31 NOTE — Telephone Encounter (Signed)
Please call in alprazolam.  

## 2015-06-01 NOTE — Telephone Encounter (Signed)
Rx called in to pharmacy. 

## 2015-06-02 ENCOUNTER — Other Ambulatory Visit: Payer: Self-pay | Admitting: *Deleted

## 2015-06-02 MED ORDER — TRAZODONE HCL 50 MG PO TABS: 50.0000 mg | ORAL_TABLET | Freq: Every evening | ORAL | Status: DC | PRN

## 2015-06-02 NOTE — Telephone Encounter (Signed)
Requesting a quantity of #180.

## 2015-06-29 ENCOUNTER — Other Ambulatory Visit: Payer: Self-pay | Admitting: Family Medicine

## 2015-06-29 NOTE — Telephone Encounter (Signed)
Please call in lorazepam.  

## 2015-07-06 ENCOUNTER — Emergency Department: Payer: 59

## 2015-07-06 ENCOUNTER — Emergency Department
Admission: EM | Admit: 2015-07-06 | Discharge: 2015-07-06 | Disposition: A | Discharge status: Home / Self Care | Payer: 59 | Attending: Emergency Medicine | Admitting: Emergency Medicine

## 2015-07-06 ENCOUNTER — Encounter: Payer: Self-pay | Admitting: Emergency Medicine

## 2015-07-06 DIAGNOSIS — X58XXXA Exposure to other specified factors, initial encounter: Secondary | ICD-10-CM | POA: Insufficient documentation

## 2015-07-06 DIAGNOSIS — Z79899 Other long term (current) drug therapy: Secondary | ICD-10-CM | POA: Insufficient documentation

## 2015-07-06 DIAGNOSIS — Y9389 Activity, other specified: Secondary | ICD-10-CM | POA: Diagnosis not present

## 2015-07-06 DIAGNOSIS — F17210 Nicotine dependence, cigarettes, uncomplicated: Secondary | ICD-10-CM | POA: Diagnosis not present

## 2015-07-06 DIAGNOSIS — S39012A Strain of muscle, fascia and tendon of lower back, initial encounter: Secondary | ICD-10-CM | POA: Diagnosis not present

## 2015-07-06 DIAGNOSIS — Y9289 Other specified places as the place of occurrence of the external cause: Secondary | ICD-10-CM | POA: Insufficient documentation

## 2015-07-06 DIAGNOSIS — F419 Anxiety disorder, unspecified: Secondary | ICD-10-CM | POA: Diagnosis not present

## 2015-07-06 DIAGNOSIS — S3992XA Unspecified injury of lower back, initial encounter: Secondary | ICD-10-CM | POA: Diagnosis present

## 2015-07-06 DIAGNOSIS — Y998 Other external cause status: Secondary | ICD-10-CM | POA: Diagnosis not present

## 2015-07-06 MED ORDER — HYDROMORPHONE HCL 1 MG/ML IJ SOLN
1.0000 mg | Freq: Once | INTRAMUSCULAR | Status: AC
Start: 2015-07-06 — End: 2015-07-06
  Administered 2015-07-06: 1 mg via INTRAMUSCULAR
  Filled 2015-07-06: qty 1

## 2015-07-06 MED ORDER — KETOROLAC TROMETHAMINE 60 MG/2ML IM SOLN
30.0000 mg | Freq: Once | INTRAMUSCULAR | Status: AC
  Administered 2015-07-06: 30 mg via INTRAMUSCULAR
  Filled 2015-07-06: qty 2

## 2015-07-06 MED ORDER — METHOCARBAMOL 750 MG PO TABS: 1500.0000 mg | ORAL_TABLET | Freq: Four times a day (QID) | ORAL | Status: DC

## 2015-07-06 MED ORDER — TRAMADOL HCL 50 MG PO TABS: 50.0000 mg | ORAL_TABLET | Freq: Four times a day (QID) | ORAL | Status: DC | PRN

## 2015-07-06 MED ORDER — METHOCARBAMOL 500 MG PO TABS
1000.0000 mg | ORAL_TABLET | Freq: Once | ORAL | Status: AC
  Administered 2015-07-06: 1000 mg via ORAL
  Filled 2015-07-06: qty 2

## 2015-07-06 NOTE — ED Notes (Signed)
Having lower back pain for the past 2-3 days   States pain radiated from mid back to left hip  Denies any recent injury

## 2015-07-06 NOTE — ED Notes (Signed)
See triage note. States he has a hx of back and hip problems in past.  No fall but did put a TV on wall couple of days ago.. Ambulates with slight limp

## 2015-07-06 NOTE — ED Provider Notes (Signed)
[facility] Emergency Department Provider Note  ____________________________________________  Time seen: Approximately 8:38 AM  I have reviewed the triage vital signs and the nursing notes.   HISTORY  Chief Complaint Back Pain    HPI Patrick Lucas is a 45 y.o. male patient complaining of left radicular back pain for 3 days. Patient denies any provocative incident for his complaint. Patient denies any bladder or bowel dysfunction. Patient stated the pain radiates to the distal left thigh. Patient states he has chronic hip pain this pain is different this originates in his back. Patient rated his pain as a 5/10. Describes pain as sharp. No palliative measures taken for this complaint.   Past Medical History  Diagnosis Date  . Anxiety   . Insomnia   . Allergic rhinitis   . Fracture of multiple ribs, right, closed. initial encounter 10/14/2014  . Fracture of one rib of right side, sequela 10/14/2014  . Sciatica 10/14/2014    Patient Active Problem List   Diagnosis Date Noted  . Allergic rhinitis 10/14/2014  . Hip pain, chronic 10/14/2014  . Diverticulitis 10/14/2014  . Otitis media of left ear 10/14/2014  . Genital herpes 11/09/2007  . Compulsive tobacco user syndrome 01/06/2007  . Alcohol dependence in remission (HCC) 11/22/2005  . Anxiety disorder 10/23/2005  . Insomnia 10/23/2005    History reviewed. No pertinent past surgical history.  Current Outpatient Rx  Name  Route  Sig  Dispense  Refill  . ALPRAZolam (XANAX) 0.5 MG tablet      TAKE 1/2 TO 1 TABLET BY MOUTH EVERY 4 HOURS AS NEEDED FOR ANXIETY   30 tablet   1     Not to exceed 4 additional fills before 09/26/2015   . escitalopram (LEXAPRO) 10 MG tablet      1/2 tablet daily for 6 days, then increase to 1 tablet daily for 6 days, then increase to 2 daily   30 tablet   0   . LORazepam (ATIVAN) 1 MG tablet      TAKE 1-2 TABLETS AT BEDTIME AS NEEDED FOR ANXIETY   90 tablet   2      Not to exceed 3 additional fills before 08/20/2015 ...   . methocarbamol (ROBAXIN-750) 750 MG tablet   Oral   Take 2 tablets (1,500 mg total) by mouth 4 (four) times daily.   40 tablet   0   . traMADol (ULTRAM) 50 MG tablet   Oral   Take 1 tablet (50 mg total) by mouth every 6 (six) hours as needed for moderate pain.   12 tablet   0   . traZODone (DESYREL) 50 MG tablet   Oral   Take 1-2 tablets (50-100 mg total) by mouth at bedtime as needed for sleep.   180 tablet   3   . valACYclovir (VALTREX) 1000 MG tablet   Oral   Take 1 tablet (1,000 mg total) by mouth daily.   90 tablet   3     Allergies Review of patient's allergies indicates no known allergies.  No family history on file.  Social History Social History  Substance Use Topics  . Smoking status: Current Every Day Smoker -- 1.00 packs/day for 30 years    Types: Cigarettes    Start date: 10/13/1984  . Smokeless tobacco: None  . Alcohol Use: No    Review of Systems Constitutional: No fever/chills Eyes: No visual changes. ENT: No sore throat. Cardiovascular: Denies chest pain. Respiratory: Denies shortness of  breath. Gastrointestinal: No abdominal pain.  No nausea, no vomiting.  No diarrhea.  No constipation. Genitourinary: Negative for dysuria. Musculoskeletal: Positive for back pain. Skin: Negative for rash. Neurological: Negative for headaches, focal weakness or numbness. Psychiatric:Anxiety  ____________________________________________   PHYSICAL EXAM:  VITAL SIGNS: ED Triage Vitals  Enc Vitals Group     BP 07/06/15 0831 143/96 mmHg     Pulse Rate 07/06/15 0831 94     Resp 07/06/15 0831 20     Temp 07/06/15 0831 98.2 F (36.8 C)     Temp Source 07/06/15 0831 Oral     SpO2 07/06/15 0831 94 %     Weight 07/06/15 0831 210 lb (95.255 kg)     Height 07/06/15 0831 6' (1.829 m)     Head Cir --      Peak Flow --      Pain Score 07/06/15 0835 5     Pain Loc --      Pain Edu? --       Excl. in GC? --     Constitutional: Alert and oriented. Well appearing and in no acute distress. Eyes: Conjunctivae are normal. PERRL. EOMI. Head: Atraumatic. Nose: No congestion/rhinnorhea. Mouth/Throat: Mucous membranes are moist.  Oropharynx non-erythematous. Neck: No stridor.  No cervical spine tenderness to palpation. Hematological/Lymphatic/Immunilogical: No cervical lymphadenopathy. Cardiovascular: Normal rate, regular rhythm. Grossly normal heart sounds.  Good peripheral circulation. Respiratory: Normal respiratory effort.  No retractions. Lungs CTAB. Gastrointestinal: Soft and nontender. No distention. No abdominal bruits. No CVA tenderness. Musculoskeletal no obvious spinal deformity. No CVA guarding. Patient has some mild guarding palpation of the left paraspinal muscle group. Patient had negative straight leg test. Neurologic:  Normal speech and language. No gross focal neurologic deficits are appreciated. No gait instability. Skin:  Skin is warm, dry and intact. No rash noted. Psychiatric: Mood and affect are normal. Speech and behavior are normal.  ____________________________________________   LABS (all labs ordered are listed, but only abnormal results are displayed)  Labs Reviewed - No data to display ____________________________________________  EKG   ____________________________________________  RADIOLOGY  No acute findings on lumbar x-ray. I, Joni Reiningonald K Dekendrick Uzelac, personally viewed and evaluated these images (plain radiographs) as part of my medical decision making, as well as reviewing the written report by the radiologist.  ____________________________________________   PROCEDURES  Procedure(s) performed: None  Critical Care performed: No  ____________________________________________   INITIAL IMPRESSION / ASSESSMENT AND PLAN / ED COURSE  Pertinent labs & imaging results that were available during my care of the patient were reviewed by me and  considered in my medical decision making (see chart for details).  Acute lumbar strain. Discussed negative x-ray findings with patient. Patient given discharge care instructions. Patient given prescription for tramadol and Robaxin. Patient advised follow-up family doctor for continued care. ____________________________________________   FINAL CLINICAL IMPRESSION(S) / ED DIAGNOSES  Final diagnoses:  Lumbar strain, initial encounter      Joni ReiningRonald K Jontrell Bushong, PA-C 07/06/15 1006  Myrna Blazeravid Matthew Schaevitz, MD 07/08/15 2101

## 2015-07-10 ENCOUNTER — Encounter: Payer: Self-pay | Admitting: Family Medicine

## 2015-07-10 ENCOUNTER — Ambulatory Visit (INDEPENDENT_AMBULATORY_CARE_PROVIDER_SITE_OTHER): Payer: PRIVATE HEALTH INSURANCE | Admitting: Family Medicine

## 2015-07-10 VITALS — BP 140/90 | HR 115 | Temp 98.0°F | Resp 16 | Wt 209.0 lb

## 2015-07-10 DIAGNOSIS — M545 Low back pain, unspecified: Secondary | ICD-10-CM

## 2015-07-10 MED ORDER — NABUMETONE 750 MG PO TABS: 1500.0000 mg | ORAL_TABLET | Freq: Every day | ORAL | Status: DC

## 2015-07-10 NOTE — Progress Notes (Signed)
       Patient: Patrick Lucas J Rossbach Male    DOB: 07/15/1970   45 y.o.   MRN: [id number] Visit Date: 07/10/2015  Today's Provider: Mila Merryonald Fisher, MD   Chief Complaint  Patient presents with  . Follow-up  . Back Pain   Subjective:    HPI   Follow up ER visit/ Back pain  Patient was seen in ER for Back and hip pain [facility]. He was treated for Acute Lumbar strain. X ray was ordered and results were negative. Patient was given a prescription for Tramadol and Robaxin. Patient was advised to follow up with family doctor for continued care.  He reports good compliance with treatment. He reports this condition is Unchanged. Patient states the Tramadol and Robaxin have not helped to relieve back pain. Patient rates pain 6:10. Sitting aggravates pain.   ------------------------------------------------------------------------------------      No Known Allergies Previous Medications   ALPRAZOLAM (XANAX) 0.5 MG TABLET    TAKE 1/2 TO 1 TABLET BY MOUTH EVERY 4 HOURS AS NEEDED FOR ANXIETY   ESCITALOPRAM (LEXAPRO) 10 MG TABLET    1/2 tablet daily for 6 days, then increase to 1 tablet daily for 6 days, then increase to 2 daily   LORAZEPAM (ATIVAN) 1 MG TABLET    TAKE 1-2 TABLETS AT BEDTIME AS NEEDED FOR ANXIETY   METHOCARBAMOL (ROBAXIN-750) 750 MG TABLET    Take 2 tablets (1,500 mg total) by mouth 4 (four) times daily.   TRAMADOL (ULTRAM) 50 MG TABLET    Take 1 tablet (50 mg total) by mouth every 6 (six) hours as needed for moderate pain.   TRAZODONE (DESYREL) 50 MG TABLET    Take 1-2 tablets (50-100 mg total) by mouth at bedtime as needed for sleep.   VALACYCLOVIR (VALTREX) 1000 MG TABLET    Take 1 tablet (1,000 mg total) by mouth daily.    Review of Systems  Constitutional: Negative for fever, chills and appetite change.  Eyes: Negative for visual disturbance.  Respiratory: Negative for chest tightness, shortness of breath and wheezing.   Cardiovascular: Negative for chest pain  and palpitations.  Gastrointestinal: Negative for nausea, vomiting and abdominal pain.  Musculoskeletal: Positive for back pain (left side of back) and arthralgias (left hip).  Neurological: Negative for dizziness, light-headedness and headaches.    Social History  Substance Use Topics  . Smoking status: Current Every Day Smoker -- 1.00 packs/day for 30 years    Types: Cigarettes    Start date: 10/13/1984  . Smokeless tobacco: Not on file  . Alcohol Use: No   Objective:   BP 140/90 mmHg  Pulse 115  Temp(Src) 98 F (36.7 C) (Oral)  Resp 16  Wt 209 lb (94.802 kg)  SpO2 100%  Physical Exam  General appearance: alert, well developed, well nourished, cooperative and in no distress Head: Normocephalic, without obvious abnormality, atraumatic Lungs: Respirations even and unlabored MS: Tender along left paralumbar and parathoracic musculature. No spine tenderness.  Neurologic: Mental status: Alert, oriented to person, place, and time, thought content appropriate.     Assessment & Plan:     1. Left-sided low back pain without sciatica  - nabumetone (RELAFEN) 750 MG tablet; Take 2 tablets (1,500 mg total) by mouth daily.  Dispense: 30 tablet; Refill: 0   Call if symptoms change or if not rapidly improving.          Mila Merryonald Fisher, MD  [facility]

## 2015-07-10 NOTE — Patient Instructions (Signed)
Can take methocarbamol as needed if you have any muscle spasm Use OTC lidocaine 4% patch as directed on label Apply ice to affected area every 4 hours for the next 2 days, then switch to heat pads.     Back Exercises The following exercises strengthen the muscles that help to support the back. They also help to keep the lower back flexible. Doing these exercises can help to prevent back pain or lessen existing pain. If you have back pain or discomfort, try doing these exercises 2-3 times each day or as told by your health care provider. When the pain goes away, do them once each day, but increase the number of times that you repeat the steps for each exercise (do more repetitions). If you do not have back pain or discomfort, do these exercises once each day or as told by your health care provider. EXERCISES Single Knee to Chest Repeat these steps 3-5 times for each leg: 1. Lie on your back on a firm bed or the floor with your legs extended. 2. Bring one knee to your chest. Your other leg should stay extended and in contact with the floor. 3. Hold your knee in place by grabbing your knee or thigh. 4. Pull on your knee until you feel a gentle stretch in your lower back. 5. Hold the stretch for 10-30 seconds. 6. Slowly release and straighten your leg. Pelvic Tilt Repeat these steps 5-10 times: 1. Lie on your back on a firm bed or the floor with your legs extended. 2. Bend your knees so they are pointing toward the ceiling and your feet are flat on the floor. 3. Tighten your lower abdominal muscles to press your lower back against the floor. This motion will tilt your pelvis so your tailbone points up toward the ceiling instead of pointing to your feet or the floor. 4. With gentle tension and even breathing, hold this position for 5-10 seconds. Cat-Cow Repeat these steps until your lower back becomes more flexible: 1. Get into a hands-and-knees position on a firm surface. Keep your hands under  your shoulders, and keep your knees under your hips. You may place padding under your knees for comfort. 2. Let your head hang down, and point your tailbone toward the floor so your lower back becomes rounded like the back of a cat. 3. Hold this position for 5 seconds. 4. Slowly lift your head and point your tailbone up toward the ceiling so your back forms a sagging arch like the back of a cow. 5. Hold this position for 5 seconds. Press-Ups Repeat these steps 5-10 times: 1. Lie on your abdomen (face-down) on the floor. 2. Place your palms near your head, about shoulder-width apart. 3. While you keep your back as relaxed as possible and keep your hips on the floor, slowly straighten your arms to raise the top half of your body and lift your shoulders. Do not use your back muscles to raise your upper torso. You may adjust the placement of your hands to make yourself more comfortable. 4. Hold this position for 5 seconds while you keep your back relaxed. 5. Slowly return to lying flat on the floor. Bridges Repeat these steps 10 times: 1. Lie on your back on a firm surface. 2. Bend your knees so they are pointing toward the ceiling and your feet are flat on the floor. 3. Tighten your buttocks muscles and lift your buttocks off of the floor until your waist is at almost the same  height as your knees. You should feel the muscles working in your buttocks and the back of your thighs. If you do not feel these muscles, slide your feet 1-2 inches farther away from your buttocks. 4. Hold this position for 3-5 seconds. 5. Slowly lower your hips to the starting position, and allow your buttocks muscles to relax completely. If this exercise is too easy, try doing it with your arms crossed over your chest. Abdominal Crunches Repeat these steps 5-10 times: 1. Lie on your back on a firm bed or the floor with your legs extended. 2. Bend your knees so they are pointing toward the ceiling and your feet are flat on  the floor. 3. Cross your arms over your chest. 4. Tip your chin slightly toward your chest without bending your neck. 5. Tighten your abdominal muscles and slowly raise your trunk (torso) high enough to lift your shoulder blades a tiny bit off of the floor. Avoid raising your torso higher than that, because it can put too much stress on your low back and it does not help to strengthen your abdominal muscles. 6. Slowly return to your starting position. Back Lifts Repeat these steps 5-10 times: 1. Lie on your abdomen (face-down) with your arms at your sides, and rest your forehead on the floor. 2. Tighten the muscles in your legs and your buttocks. 3. Slowly lift your chest off of the floor while you keep your hips pressed to the floor. Keep the back of your head in line with the curve in your back. Your eyes should be looking at the floor. 4. Hold this position for 3-5 seconds. 5. Slowly return to your starting position. SEEK MEDICAL CARE IF:  Your back pain or discomfort gets much worse when you do an exercise.  Your back pain or discomfort does not lessen within 2 hours after you exercise. If you have any of these problems, stop doing these exercises right away. Do not do them again unless your health care provider says that you can. SEEK IMMEDIATE MEDICAL CARE IF:  You develop sudden, severe back pain. If this happens, stop doing the exercises right away. Do not do them again unless your health care provider says that you can.   This information is not intended to replace advice given to you by your health care provider. Make sure you discuss any questions you have with your health care provider.   Document Released: 05/23/2004 Document Revised: 01/04/2015 Document Reviewed: 06/09/2014 Elsevier Interactive Patient Education [employer].

## 2015-08-07 ENCOUNTER — Other Ambulatory Visit: Payer: Self-pay | Admitting: Family Medicine

## 2015-08-07 NOTE — Telephone Encounter (Signed)
Please call in lorazepam.  

## 2015-08-07 NOTE — Telephone Encounter (Signed)
Rx called in to pharmacy. 

## 2015-10-10 ENCOUNTER — Other Ambulatory Visit: Payer: Self-pay | Admitting: Emergency Medicine

## 2015-10-10 MED ORDER — ALPRAZOLAM 0.5 MG PO TABS: ORAL_TABLET | ORAL | Status: DC

## 2015-10-10 NOTE — Telephone Encounter (Signed)
Rx called in to pharmacy. 

## 2015-10-10 NOTE — Telephone Encounter (Signed)
Please call in alprazolam.  

## 2015-10-10 NOTE — Telephone Encounter (Signed)
Pt requesting refill on Xanax 0.5 mg 1-2 daily.   [facility]

## 2015-10-12 IMAGING — CR DG KNEE COMPLETE 4+V*R*
1 series · 4 of 4 positions shown · non-contrast
Comparison: None.

CLINICAL DATA: Pt tripped and fell over dog on [REDACTED]. Right
anterior and medial knee pain.

EXAM:
RIGHT KNEE - COMPLETE 4+ VIEW

[Series 1: dg knee complete 4 views right · 0.14mm/px · 4 of 4 slices shown]
[im 1/4]
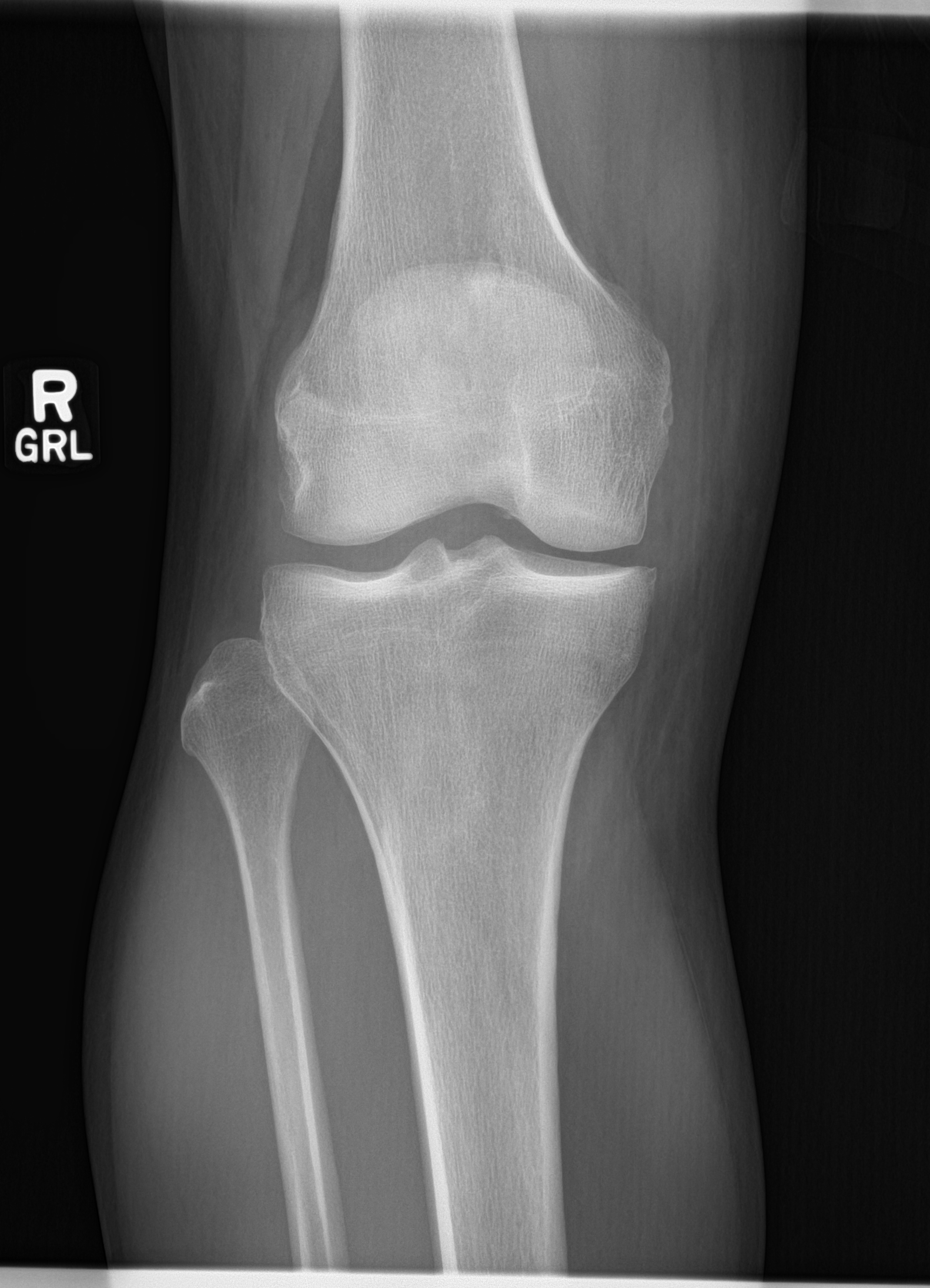
[im 2/4]
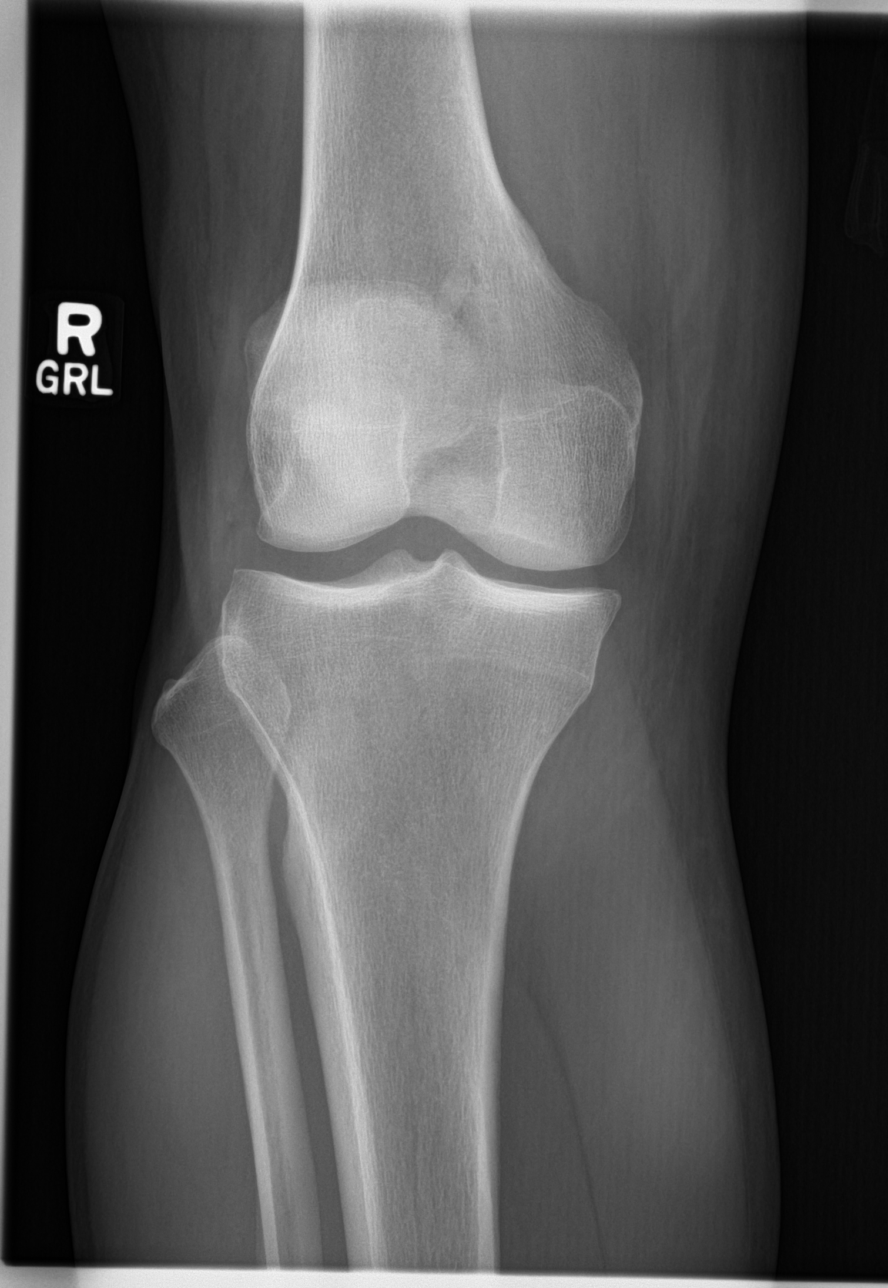
[im 3/4]
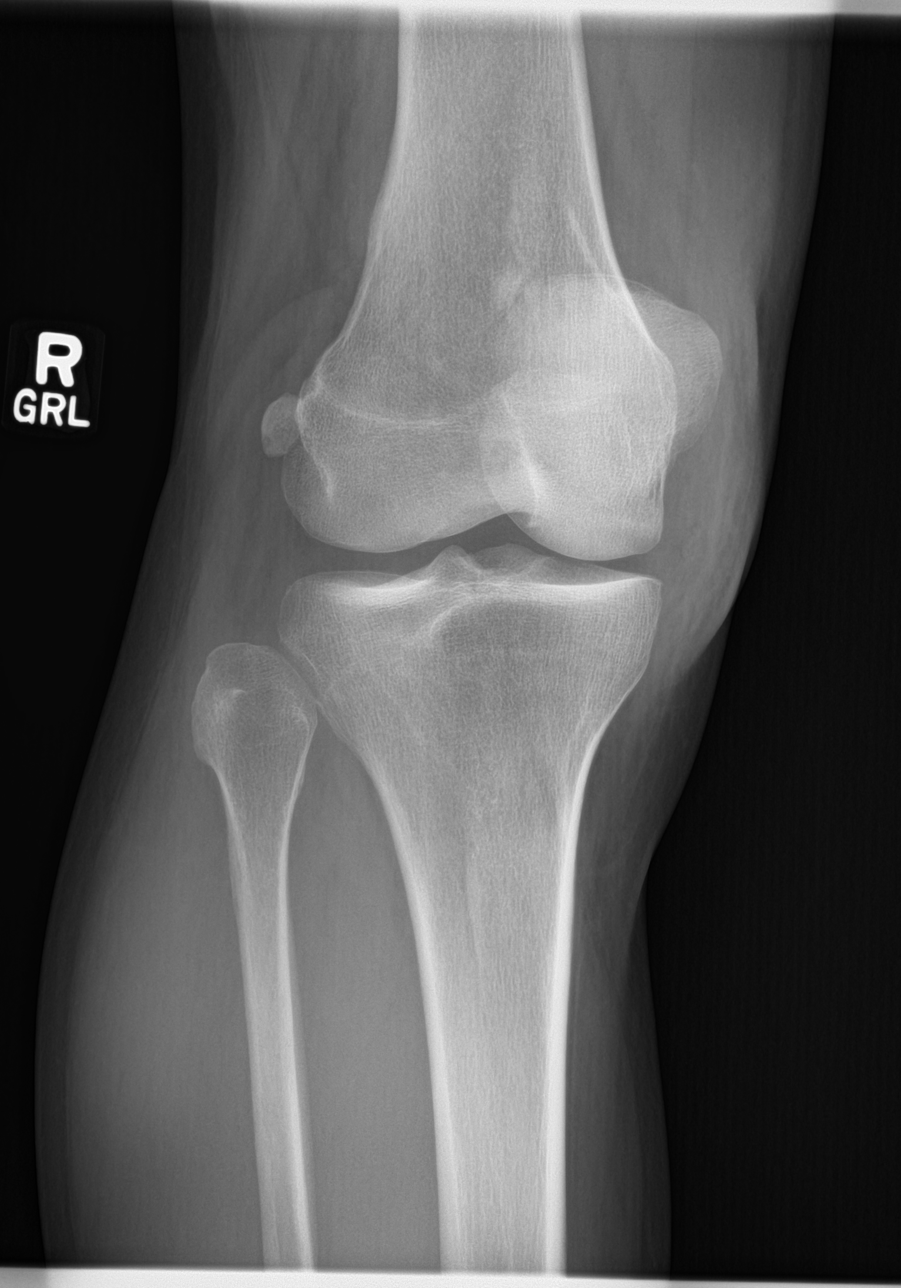
[im 4/4]
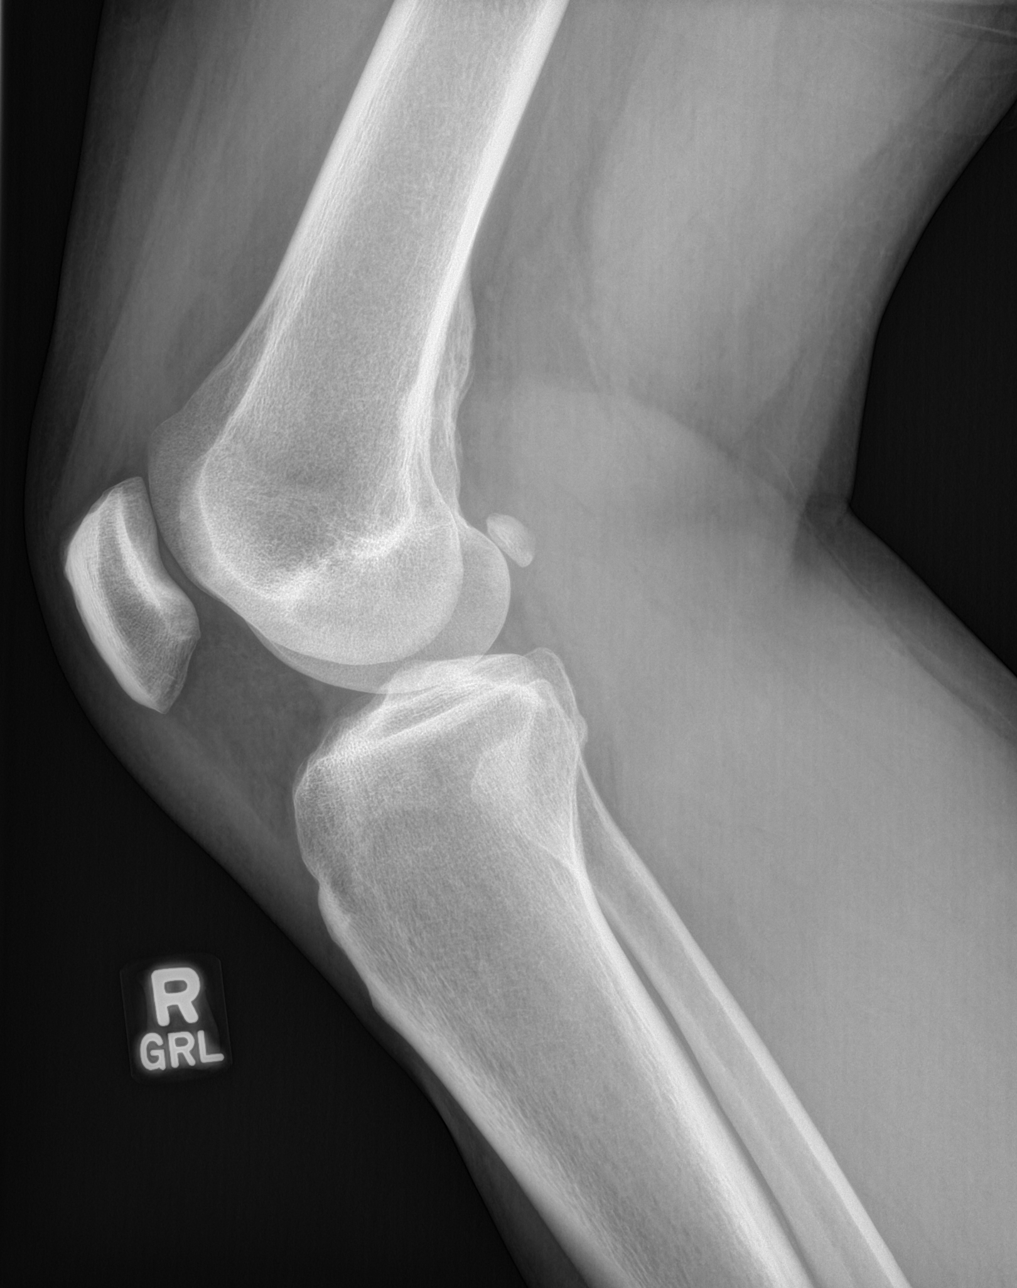

[4 of 4 positions shown; findings below may reference images not displayed]

FINDINGS: There is no evidence of fracture, dislocation. Small joint effusion
is present. Fabella, normal variant.
IMPRESSION: Small joint effusion.

## 2015-10-12 IMAGING — CR DG HAND COMPLETE 3+V*L*
1 series · 3 of 3 positions shown · non-contrast
Comparison: None.

CLINICAL DATA: Pt tripped and fell over dog on [REDACTED]. Right
anterior and medial knee pain. Left hand pain and swelling and the
left ring finger.

EXAM:
LEFT HAND - COMPLETE 3+ VIEW

[Series 1: dg hand complete left · 0.14mm/px · 3 of 3 slices shown]
[im 1/3]
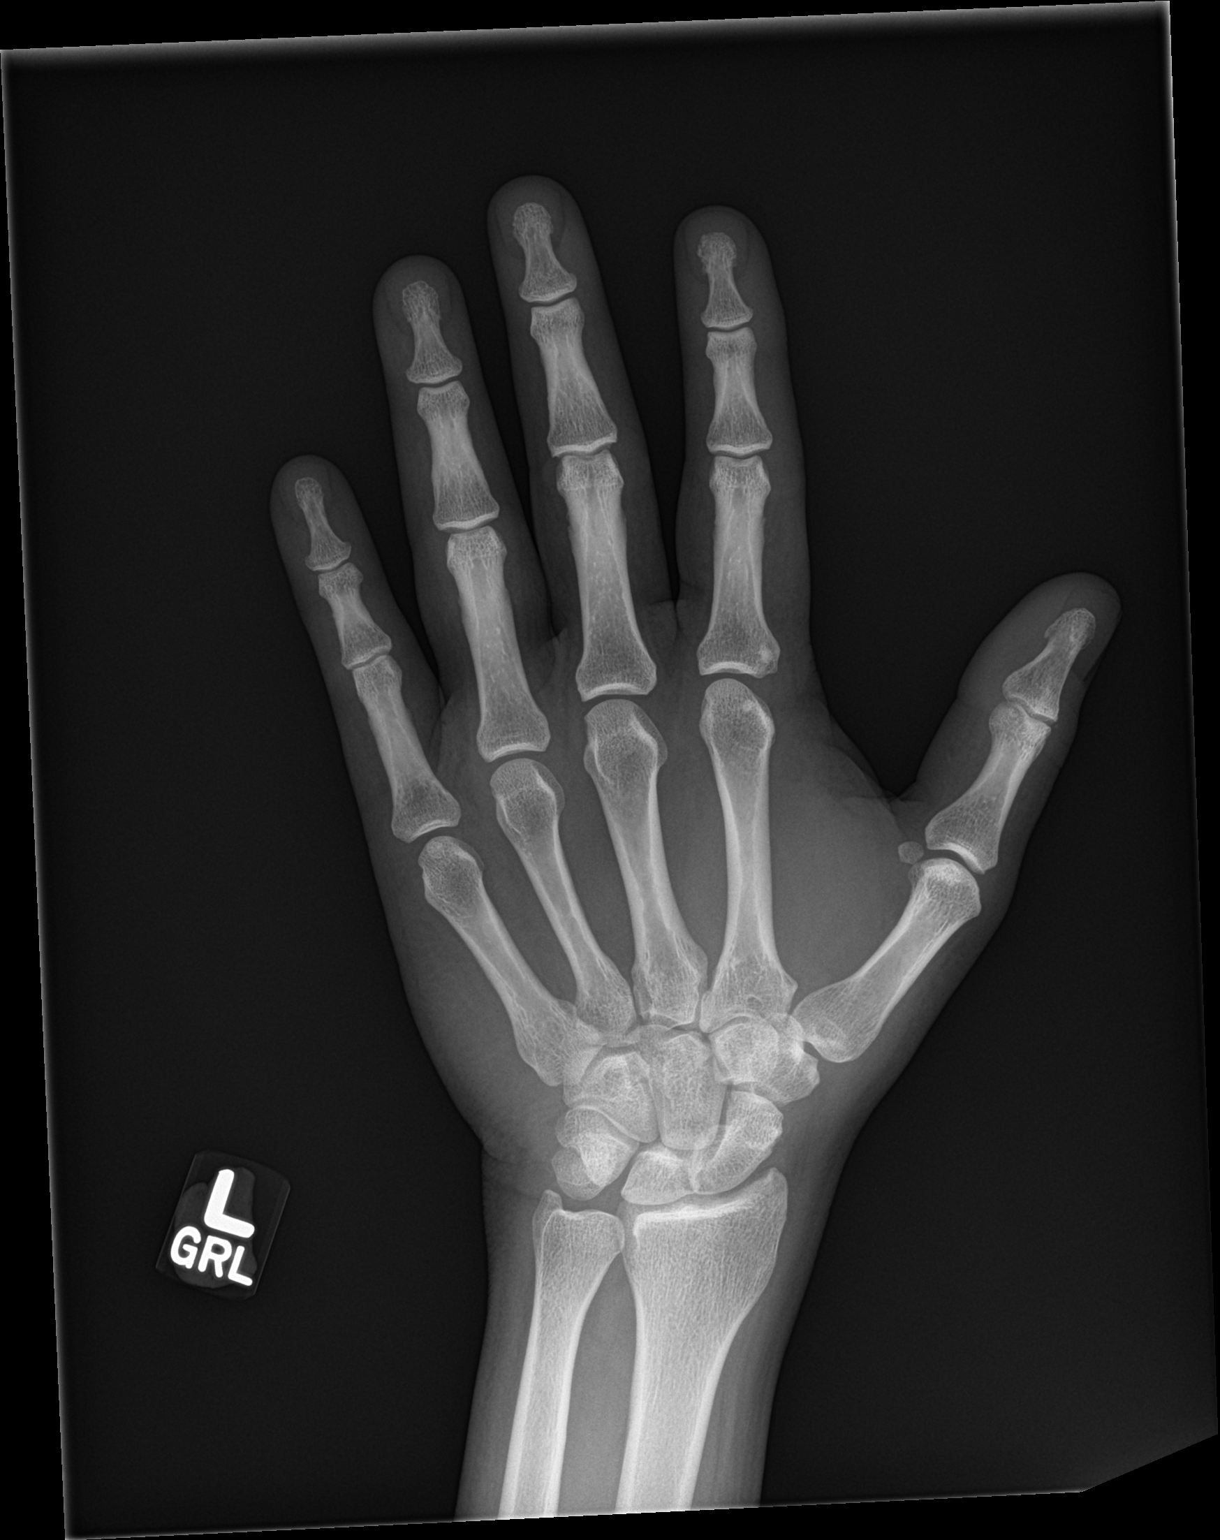
[im 2/3]
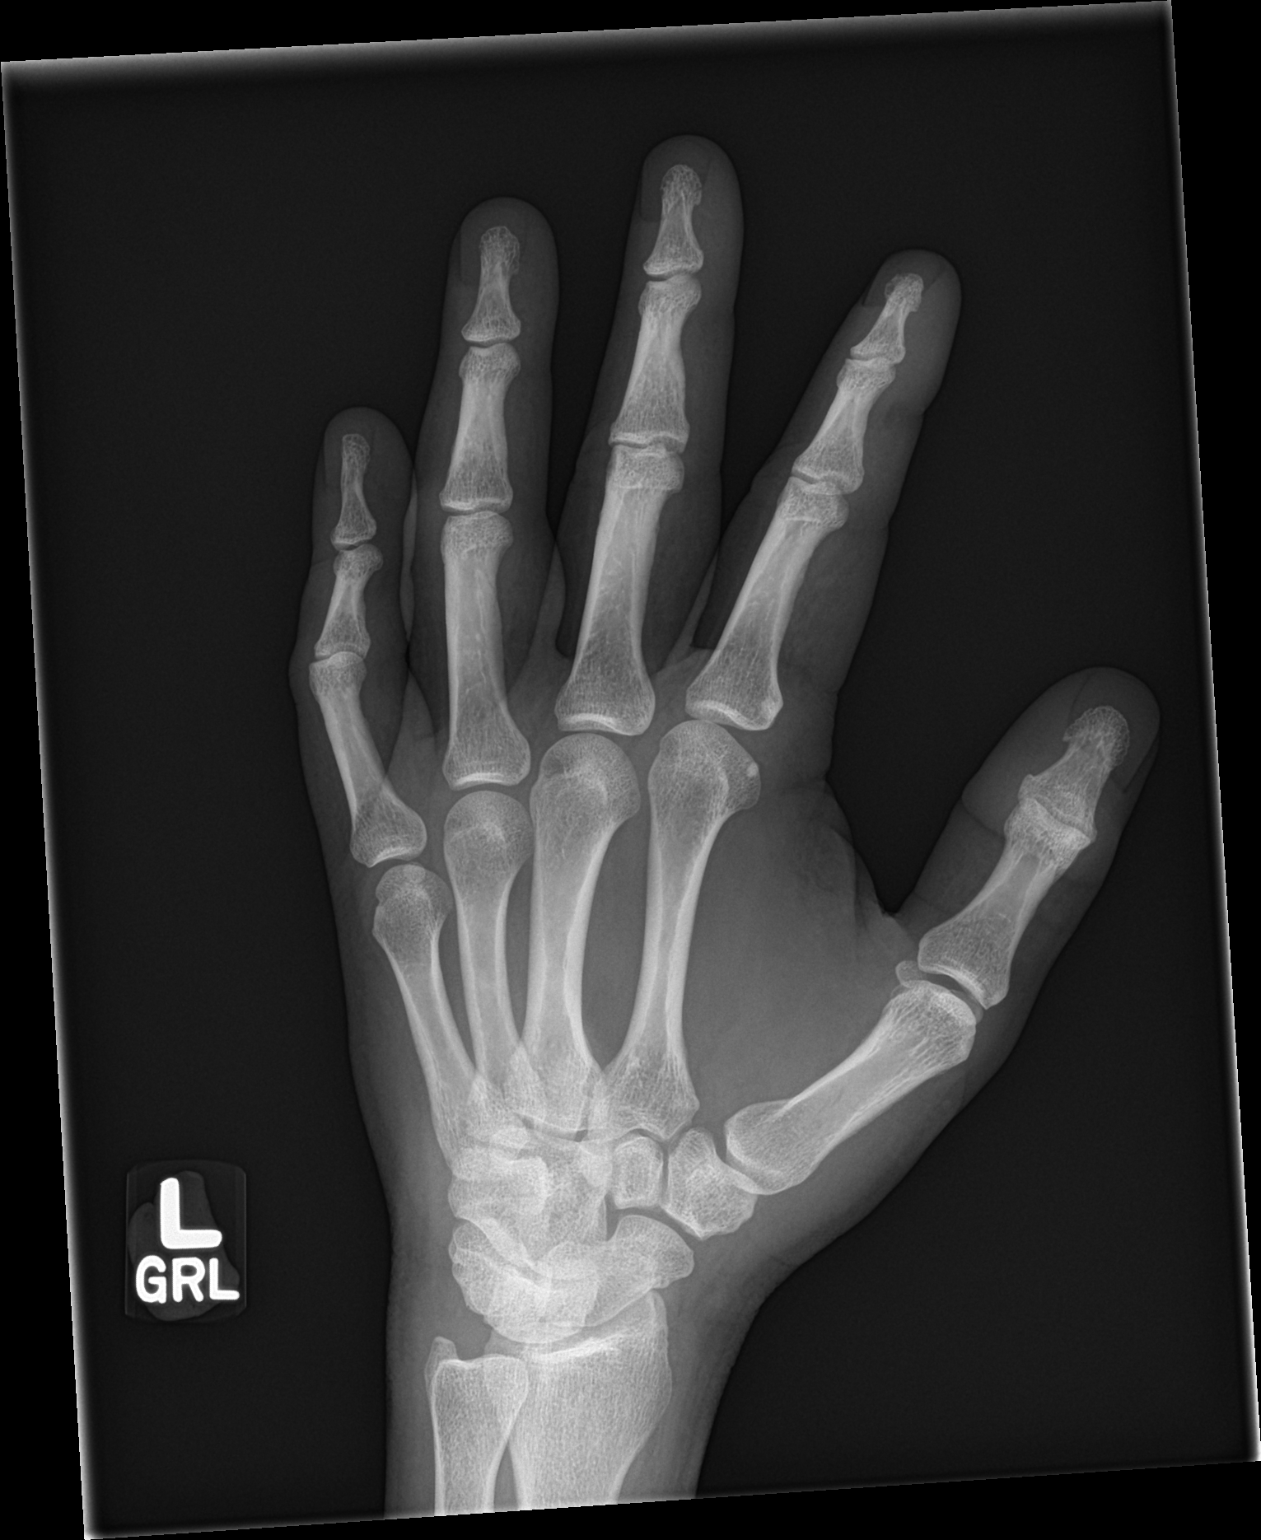
[im 3/3]
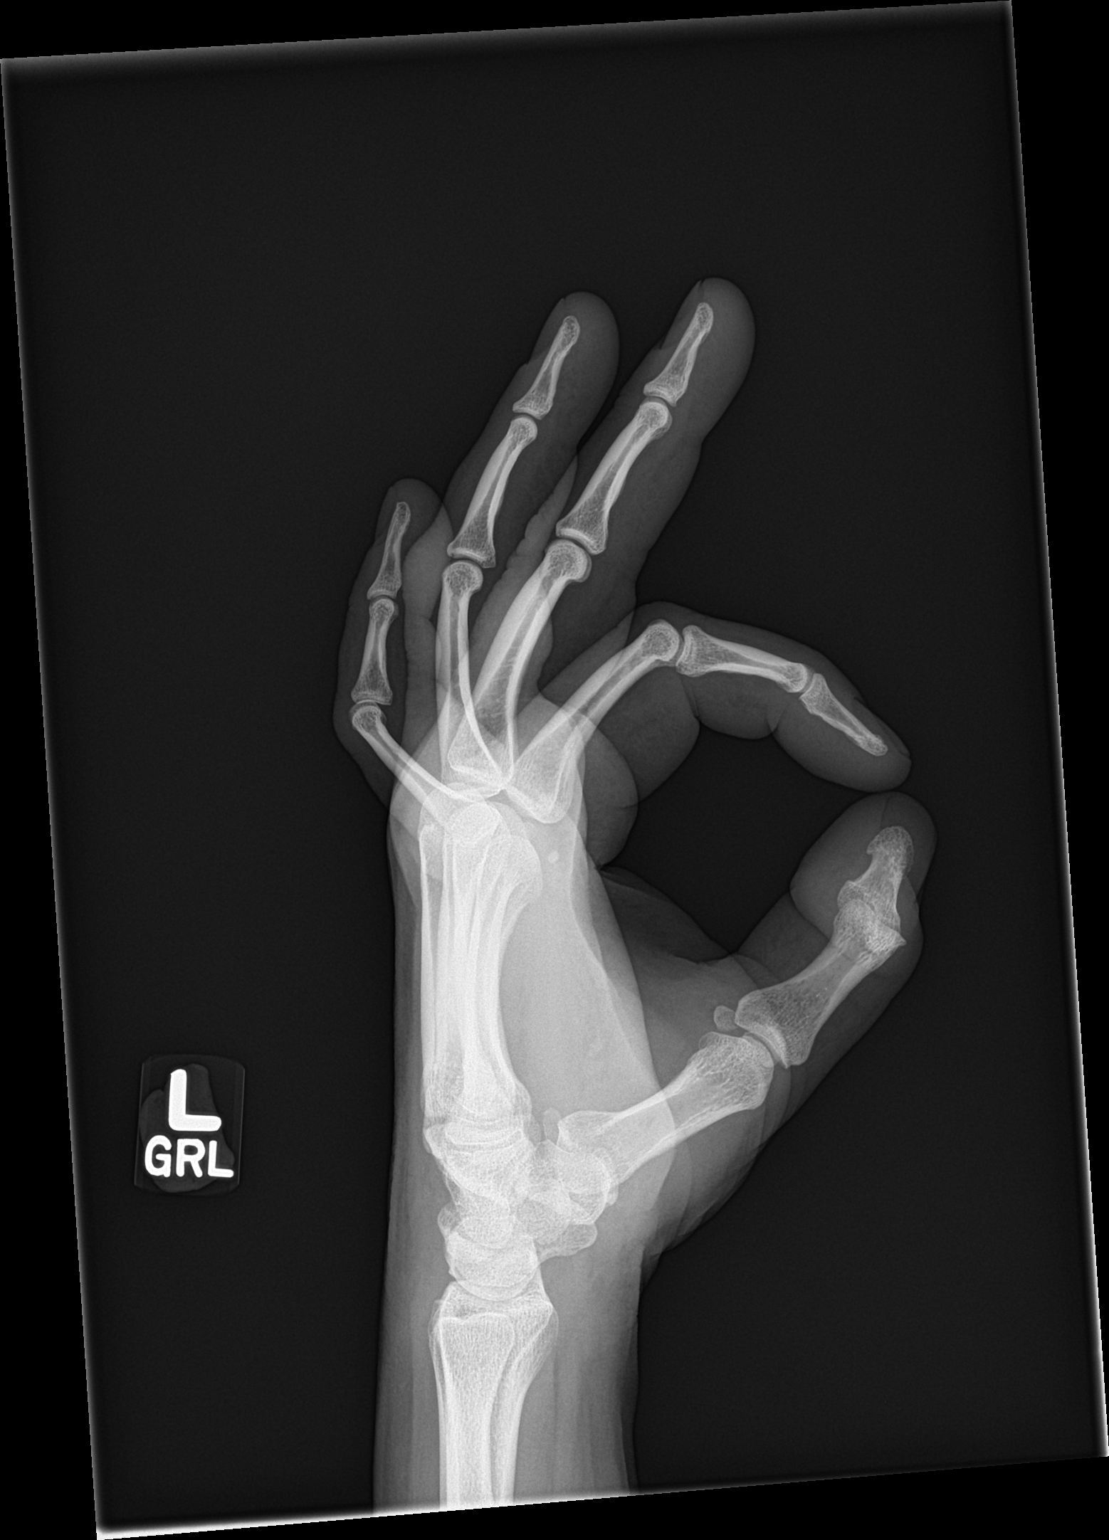

[3 of 3 positions shown; findings below may reference images not displayed]

FINDINGS: Subtle angular linear lucency at the base of the middle phalanx of
the fourth digit seen on the lateral projection. This linear lucency
is nondisplaced but does extend to the articular surface. No
dislocation.
IMPRESSION: Potential nondisplaced fracture at the base of the middle phalanx
fourth digit.

## 2016-02-18 ENCOUNTER — Other Ambulatory Visit: Payer: Self-pay | Admitting: Family Medicine

## 2016-02-18 NOTE — Telephone Encounter (Signed)
Please call in lorazepam.  

## 2016-02-19 NOTE — Telephone Encounter (Signed)
Rx called in to pharmacy. 

## 2016-02-21 ENCOUNTER — Ambulatory Visit (INDEPENDENT_AMBULATORY_CARE_PROVIDER_SITE_OTHER): Payer: PRIVATE HEALTH INSURANCE | Admitting: Family Medicine

## 2016-02-21 ENCOUNTER — Encounter: Payer: Self-pay | Admitting: Family Medicine

## 2016-02-21 VITALS — BP 130/100 | HR 93 | Temp 97.5°F | Resp 16 | Wt 210.0 lb

## 2016-02-21 DIAGNOSIS — B349 Viral infection, unspecified: Secondary | ICD-10-CM

## 2016-02-21 NOTE — Progress Notes (Signed)
Subjective:     Patient ID: Patrick Lucas, male   DOB: 08/25/1970, 45 y.o.   MRN: [id number]  HPI  Chief Complaint  Patient presents with  . Dizziness    Patient comes in office today with concerns of dizziness, lightheaded, weakness and fatigue for the past 3 days. Patient states associated with symptoms he has had chills, body sweats and now onset of sinus congestion.   States fatigue is significant and he has been missing work intermittently since 10/20. Reports he visited once of his friends who was ill prior to onset of sx. Works as a Chartered certified accountantmachinist.    Review of Systems  Genitourinary: Negative for dysuria.       Objective:   Physical Exam  Constitutional: He appears well-developed and well-nourished. No distress.  Ears: T.M's intact without inflammationr Throat: no tonsillar enlargement or exudate Neck: no cervical adenopathy Lungs: clear     Assessment:    1. Acute viral syndrome - CBC with Differential/Platelet    Plan:    Work excuse for 10/20-10/27. Further f/u pending lab work.

## 2016-02-21 NOTE — Patient Instructions (Signed)
Discussed use of Mucinex D for congestion, Delsym for cough, and Benadryl for postnasal drainage 

## 2016-02-22 ENCOUNTER — Telehealth: Payer: Self-pay

## 2016-02-22 LAB — CBC WITH DIFFERENTIAL/PLATELET
BASOS ABS: 0 10*3/uL (ref 0.0–0.2)
BASOS: 0 %
EOS (ABSOLUTE): 0.2 10*3/uL (ref 0.0–0.4)
EOS: 2 %
HEMATOCRIT: 46.1 % (ref 37.5–51.0)
HEMOGLOBIN: 16.3 g/dL (ref 12.6–17.7)
IMMATURE GRANS (ABS): 0 10*3/uL (ref 0.0–0.1)
Immature Granulocytes: 0 %
LYMPHS ABS: 2.2 10*3/uL (ref 0.7–3.1)
LYMPHS: 28 %
MCH: 31 pg (ref 26.6–33.0)
MCHC: 35.4 g/dL (ref 31.5–35.7)
MCV: 88 fL (ref 79–97)
MONOCYTES: 6 %
Monocytes Absolute: 0.5 10*3/uL (ref 0.1–0.9)
NEUTROS ABS: 5.2 10*3/uL (ref 1.4–7.0)
Neutrophils: 64 %
Platelets: 269 10*3/uL (ref 150–379)
RBC: 5.26 x10E6/uL (ref 4.14–5.80)
RDW: 13.3 % (ref 12.3–15.4)
WBC: 8.1 10*3/uL (ref 3.4–10.8)

## 2016-02-22 NOTE — Telephone Encounter (Signed)
[id number] 

## 2016-02-22 NOTE — Telephone Encounter (Signed)
-----   Message from Anola Gurneyobert Chauvin, [place] sent at 02/22/2016  7:35 AM EDT ----- Blood count normal. Suspect you will develop other viral upper respiratory symptoms

## 2016-02-22 NOTE — Telephone Encounter (Signed)
Pt returned call. Thanks TNP °

## 2016-02-22 NOTE — Telephone Encounter (Signed)
Patient was advised. KW 

## 2016-04-26 ENCOUNTER — Other Ambulatory Visit: Payer: Self-pay | Admitting: Family Medicine

## 2016-04-29 NOTE — Telephone Encounter (Signed)
Please call in alprazolam.  

## 2016-04-30 NOTE — Telephone Encounter (Signed)
Rx called in to pharmacy. 

## 2016-07-06 ENCOUNTER — Other Ambulatory Visit: Payer: Self-pay | Admitting: Family Medicine

## 2016-07-07 NOTE — Telephone Encounter (Signed)
Please call in lorazepam.  

## 2016-07-08 NOTE — Telephone Encounter (Signed)
Rx called in to pharmacy. 

## 2016-07-16 ENCOUNTER — Ambulatory Visit (INDEPENDENT_AMBULATORY_CARE_PROVIDER_SITE_OTHER): Payer: PRIVATE HEALTH INSURANCE | Admitting: Family Medicine

## 2016-07-16 ENCOUNTER — Encounter: Payer: Self-pay | Admitting: Family Medicine

## 2016-07-16 VITALS — BP 140/90 | HR 107 | Temp 97.7°F | Resp 16 | Wt 209.0 lb

## 2016-07-16 DIAGNOSIS — K5792 Diverticulitis of intestine, part unspecified, without perforation or abscess without bleeding: Secondary | ICD-10-CM

## 2016-07-16 MED ORDER — METRONIDAZOLE 500 MG PO TABS: 500.0000 mg | ORAL_TABLET | Freq: Two times a day (BID) | ORAL | 0 refills | Status: AC

## 2016-07-16 MED ORDER — CIPROFLOXACIN HCL 500 MG PO TABS: 500.0000 mg | ORAL_TABLET | Freq: Two times a day (BID) | ORAL | 0 refills | Status: AC

## 2016-07-16 NOTE — Progress Notes (Signed)
Patient: Patrick Lucas Male    DOB: 10/15/1970   46 y.o.   MRN: [id number] Visit Date: 07/16/2016  Today's Provider: Mila Merry, MD   No chief complaint on file.  Subjective:    Patient stated that he has had lower abdominal pain for 4 days. Patient believes his diverticulitis has flared-up again. Patient stated that he had about two days of Cipro and flagyl left over from a previous prescription, with no relief.   Abdominal Pain  This is a recurrent problem. The current episode started in the past 7 days (4 days ago). The problem occurs constantly. The abdominal pain radiates to the LLQ and LUQ. Pertinent negatives include no anorexia, arthralgias, belching, constipation, diarrhea, dysuria, fever, flatus, frequency, headaches, hematochezia, hematuria, melena, myalgias, nausea, vomiting or weight loss. Nothing aggravates the pain. The pain is relieved by nothing. He has tried antibiotics for the symptoms. The treatment provided no relief. diverticulitis      No Known Allergies   Current Outpatient Prescriptions:  .  ALPRAZolam (XANAX) 0.5 MG tablet, TAKE 1/2 TO 1 TABLET BY MOUTH EVERY 4 HOURS AS NEEDED FOR ANXIETY, Disp: 30 tablet, Rfl: 3 .  LORazepam (ATIVAN) 1 MG tablet, TAKE 1 TO 2 TABLETS BY MOUTH AT BEDTIME AS NEEDED ANXIETY, Disp: 90 tablet, Rfl: 1 .  traMADol (ULTRAM) 50 MG tablet, Take 1 tablet (50 mg total) by mouth every 6 (six) hours as needed for moderate pain., Disp: 12 tablet, Rfl: 0 .  traZODone (DESYREL) 50 MG tablet, Take 1-2 tablets (50-100 mg total) by mouth at bedtime as needed for sleep., Disp: 180 tablet, Rfl: 3 .  valACYclovir (VALTREX) 1000 MG tablet, Take by mouth., Disp: , Rfl:   Review of Systems  Constitutional: Negative for appetite change, chills, fever and weight loss.  Respiratory: Negative for chest tightness, shortness of breath and wheezing.   Cardiovascular: Negative for chest pain and palpitations.  Gastrointestinal: Positive for  abdominal pain. Negative for anorexia, constipation, diarrhea, flatus, hematochezia, melena, nausea and vomiting.  Genitourinary: Negative for dysuria, frequency and hematuria.  Musculoskeletal: Negative for arthralgias and myalgias.  Neurological: Negative for headaches.    Social History  Substance Use Topics  . Smoking status: Current Every Day Smoker    Packs/day: 1.00    Years: 30.00    Types: Cigarettes    Start date: 10/13/1984  . Smokeless tobacco: Not on file  . Alcohol use No   Objective:   BP 140/90 (BP Location: Right Arm, Patient Position: Sitting, Cuff Size: Large)   Pulse (!) 107   Temp 97.7 F (36.5 C) (Oral)   Resp 16   Wt 209 lb (94.8 kg)   SpO2 96%   BMI 28.35 kg/m    Physical Exam  General Appearance:    Alert, cooperative, no distress  Eyes:    PERRL, conjunctiva/corneas clear, EOM's intact       Lungs:     Clear to auscultation bilaterally, respirations unlabored  Heart:    Regular rate and rhythm  Abdomen:   bowel sounds present and normal in all 4 quadrants, soft, round, non-distended. Mild LLQ and mid lower abdominal tenderness. [name] No CVA tenderness        Assessment & Plan:     1. Diverticulitis of intestine without perforation or abscess without bleeding, unspecified part of intestinal tract  - ciprofloxacin (CIPRO) 500 MG tablet; Take 1 tablet (500 mg total) by mouth 2 (two) times daily.  Dispense: 20  tablet; Refill: 0 - metroNIDAZOLE (FLAGYL) 500 MG tablet; Take 1 tablet (500 mg total) by mouth 2 (two) times daily.  Dispense: 20 tablet; Refill: 0   Call if symptoms change or if not rapidly improving.          Mila Merryonald Fisher, MD  [facility]

## 2016-09-27 ENCOUNTER — Other Ambulatory Visit: Payer: Self-pay | Admitting: Family Medicine

## 2016-09-27 MED ORDER — ALPRAZOLAM 0.5 MG PO TABS: ORAL_TABLET | ORAL | 5 refills | Status: DC

## 2016-09-27 NOTE — Telephone Encounter (Signed)
[facility]  

## 2016-09-27 NOTE — Telephone Encounter (Signed)
Please call in alprazolam.  

## 2016-09-27 NOTE — Telephone Encounter (Signed)
Pt needs refill  ALPRAZolam (XANAX) 0.5 MG tablet  [facility]  Thanks Fortune Brandsteri

## 2016-10-28 ENCOUNTER — Encounter: Payer: Self-pay | Admitting: Emergency Medicine

## 2016-10-28 ENCOUNTER — Emergency Department
Admission: EM | Admit: 2016-10-28 | Discharge: 2016-10-28 | Disposition: A | Discharge status: Other Acute Inpatient Hospital | Payer: 59 | Attending: Emergency Medicine | Admitting: Emergency Medicine

## 2016-10-28 ENCOUNTER — Inpatient Hospital Stay
Admission: EM | Admit: 2016-10-28 | Discharge: 2016-11-01 | DRG: 885 | Disposition: A | Discharge status: Home / Self Care | Payer: 59 | Source: Intra-hospital | Attending: Psychiatry | Admitting: Psychiatry

## 2016-10-28 DIAGNOSIS — T1491XA Suicide attempt, initial encounter: Secondary | ICD-10-CM | POA: Diagnosis not present

## 2016-10-28 DIAGNOSIS — M549 Dorsalgia, unspecified: Secondary | ICD-10-CM | POA: Diagnosis present

## 2016-10-28 DIAGNOSIS — G8929 Other chronic pain: Secondary | ICD-10-CM | POA: Diagnosis present

## 2016-10-28 DIAGNOSIS — F17200 Nicotine dependence, unspecified, uncomplicated: Secondary | ICD-10-CM

## 2016-10-28 DIAGNOSIS — Z630 Problems in relationship with spouse or partner: Secondary | ICD-10-CM | POA: Insufficient documentation

## 2016-10-28 DIAGNOSIS — F4325 Adjustment disorder with mixed disturbance of emotions and conduct: Secondary | ICD-10-CM

## 2016-10-28 DIAGNOSIS — F322 Major depressive disorder, single episode, severe without psychotic features: Secondary | ICD-10-CM | POA: Diagnosis present

## 2016-10-28 DIAGNOSIS — X838XXA Intentional self-harm by other specified means, initial encounter: Secondary | ICD-10-CM | POA: Insufficient documentation

## 2016-10-28 DIAGNOSIS — Z566 Other physical and mental strain related to work: Secondary | ICD-10-CM | POA: Insufficient documentation

## 2016-10-28 DIAGNOSIS — Y929 Unspecified place or not applicable: Secondary | ICD-10-CM | POA: Insufficient documentation

## 2016-10-28 DIAGNOSIS — T424X2A Poisoning by benzodiazepines, intentional self-harm, initial encounter: Secondary | ICD-10-CM | POA: Insufficient documentation

## 2016-10-28 DIAGNOSIS — G4700 Insomnia, unspecified: Secondary | ICD-10-CM | POA: Diagnosis present

## 2016-10-28 DIAGNOSIS — Y999 Unspecified external cause status: Secondary | ICD-10-CM | POA: Diagnosis not present

## 2016-10-28 DIAGNOSIS — M25559 Pain in unspecified hip: Secondary | ICD-10-CM | POA: Diagnosis present

## 2016-10-28 DIAGNOSIS — F17210 Nicotine dependence, cigarettes, uncomplicated: Secondary | ICD-10-CM | POA: Insufficient documentation

## 2016-10-28 DIAGNOSIS — I10 Essential (primary) hypertension: Secondary | ICD-10-CM | POA: Diagnosis present

## 2016-10-28 DIAGNOSIS — Z79899 Other long term (current) drug therapy: Secondary | ICD-10-CM

## 2016-10-28 DIAGNOSIS — Z915 Personal history of self-harm: Secondary | ICD-10-CM

## 2016-10-28 DIAGNOSIS — Y939 Activity, unspecified: Secondary | ICD-10-CM | POA: Diagnosis not present

## 2016-10-28 DIAGNOSIS — R45851 Suicidal ideations: Secondary | ICD-10-CM | POA: Diagnosis present

## 2016-10-28 DIAGNOSIS — F419 Anxiety disorder, unspecified: Secondary | ICD-10-CM | POA: Diagnosis not present

## 2016-10-28 DIAGNOSIS — F1021 Alcohol dependence, in remission: Secondary | ICD-10-CM | POA: Diagnosis present

## 2016-10-28 LAB — COMPREHENSIVE METABOLIC PANEL
ALBUMIN: 4 g/dL (ref 3.5–5.0)
ALK PHOS: 73 U/L (ref 38–126)
ALT: 24 U/L (ref 17–63)
AST: 27 U/L (ref 15–41)
Anion gap: 5 (ref 5–15)
BUN: 18 mg/dL (ref 6–20)
CALCIUM: 8.8 mg/dL — AB (ref 8.9–10.3)
CHLORIDE: 104 mmol/L (ref 101–111)
CO2: 26 mmol/L (ref 22–32)
CREATININE: 0.92 mg/dL (ref 0.61–1.24)
GFR calc Af Amer: >60 mL/min (ref >60)
GFR calc non Af Amer: >60 mL/min (ref >60)
GLUCOSE: 104 mg/dL — AB (ref 65–99)
Potassium: 3.8 mmol/L (ref 3.5–5.1)
SODIUM: 135 mmol/L (ref 135–145)
Total Bilirubin: 0.5 mg/dL (ref 0.3–1.2)
Total Protein: 6.8 g/dL (ref 6.5–8.1)

## 2016-10-28 LAB — ACETAMINOPHEN LEVEL: Acetaminophen (Tylenol), Serum: <10 ug/mL — ABNORMAL LOW (ref 10–30)

## 2016-10-28 LAB — CBC
HEMATOCRIT: 43.2 % (ref 40.0–52.0)
Hemoglobin: 14.9 g/dL (ref 13.0–18.0)
MCH: 29.8 pg (ref 26.0–34.0)
MCHC: 34.6 g/dL (ref 32.0–36.0)
MCV: 86.1 fL (ref 80.0–100.0)
PLATELETS: 221 10*3/uL (ref 150–440)
RBC: 5.02 MIL/uL (ref 4.40–5.90)
RDW: 13.4 % (ref 11.5–14.5)
WBC: 7 10*3/uL (ref 3.8–10.6)

## 2016-10-28 LAB — SALICYLATE LEVEL: Salicylate Lvl: <7 mg/dL (ref 2.8–30.0)

## 2016-10-28 LAB — ETHANOL: Alcohol, Ethyl (B): <5 mg/dL (ref <5)

## 2016-10-28 MED ORDER — ACETAMINOPHEN 325 MG PO TABS
650.0000 mg | ORAL_TABLET | Freq: Four times a day (QID) | ORAL | Status: DC | PRN
  Administered 2016-10-31: 650 mg via ORAL
  Filled 2016-10-28: qty 2

## 2016-10-28 MED ORDER — MAGNESIUM HYDROXIDE 400 MG/5ML PO SUSP: 30.0000 mL | Freq: Every day | ORAL | Status: DC | PRN

## 2016-10-28 MED ORDER — HYDROXYZINE HCL 50 MG PO TABS: 50.0000 mg | ORAL_TABLET | Freq: Three times a day (TID) | ORAL | Status: DC | PRN

## 2016-10-28 MED ORDER — TRAZODONE HCL 100 MG PO TABS
100.0000 mg | ORAL_TABLET | Freq: Every evening | ORAL | Status: DC | PRN
  Administered 2016-10-28: 100 mg via ORAL
  Filled 2016-10-28: qty 1

## 2016-10-28 MED ORDER — ALUM & MAG HYDROXIDE-SIMETH 200-200-20 MG/5ML PO SUSP: 30.0000 mL | ORAL | Status: DC | PRN

## 2016-10-28 NOTE — ED Notes (Signed)
Nurse Amy took patient ,patient on phone

## 2016-10-28 NOTE — ED Notes (Signed)
Per MD Schaevitz, hold on urine at this time.

## 2016-10-28 NOTE — ED Notes (Signed)
Patient sitting up in bed eating lunch

## 2016-10-28 NOTE — Tx Team (Signed)
Initial Treatment Plan 10/28/2016 7:27 PM [name] J Rost [id number]    PATIENT STRESSORS: Marital or family conflict Other: suicide attempt   PATIENT STRENGTHS: Average or above average intelligence Capable of independent living   PATIENT IDENTIFIED PROBLEMS: Marital problems  Depression  Suicide attempt                 DISCHARGE CRITERIA:  Improved stabilization in mood, thinking, and/or behavior Reduction of life-threatening or endangering symptoms to within safe limits  PRELIMINARY DISCHARGE PLAN: Attend aftercare/continuing care group Participate in family therapy  PATIENT/FAMILY INVOLVEMENT: This treatment plan has been presented to and reviewed with the patient, [name] J Thien.  The patient and family have been given the opportunity to ask questions and make suggestions.  Jackelyn PolingSunny  Delanee Xin, RN 10/28/2016, 7:27 PM

## 2016-10-28 NOTE — ED Provider Notes (Addendum)
[facility] Emergency Department Provider Note  ____________________________________________   First MD Initiated Contact with Patient 10/28/16 [phone]     (approximate)  I have reviewed the triage vital signs and the nursing notes.   HISTORY  Chief Complaint Drug Overdose and Suicidal   HPI Patrick Brunsony J Lucas is a 46 y.o. male with a history of anxiety who is presenting to the emergency department after suicide attempts. He says that he, yesterday morning at 7:30 AM, took multiple Xanax, Ativan and beers. He says that he is visiting now almost 24 hours afterwards because of his wife requesting that he be evaluated. He says that he just does not want to "be here" anymore. He says that he is about to lose his job and also that his wife has been upset with him lately.   Past Medical History:  Diagnosis Date  . Allergic rhinitis   . Anxiety   . Fracture of multiple ribs, right, closed. initial encounter 10/14/2014  . Fracture of one rib of right side, sequela 10/14/2014  . Insomnia   . Sciatica 10/14/2014    Patient Active Problem List   Diagnosis Date Noted  . Allergic rhinitis 10/14/2014  . Hip pain, chronic 10/14/2014  . Diverticulitis 10/14/2014  . Otitis media of left ear 10/14/2014  . Genital herpes 11/09/2007  . Compulsive tobacco user syndrome 01/06/2007  . Alcohol dependence in remission (HCC) 11/22/2005  . Anxiety disorder 10/23/2005  . Insomnia 10/23/2005    History reviewed. No pertinent surgical history.  Prior to Admission medications   Medication Sig Start Date End Date Taking? Authorizing Provider  ALPRAZolam (XANAX) 0.5 MG tablet TAKE 1/2 TO 1 TABLET BY MOUTH EVERY 4 HOURS AS NEEDED FOR ANXIETY 09/27/16   Malva LimesFisher, Donald E, MD  LORazepam (ATIVAN) 1 MG tablet TAKE 1 TO 2 TABLETS BY MOUTH AT BEDTIME AS NEEDED ANXIETY 07/07/16   Malva LimesFisher, Donald E, MD  traZODone (DESYREL) 50 MG tablet Take 1-2 tablets (50-100 mg total) by mouth at bedtime as  needed for sleep. 06/02/15   Malva LimesFisher, Donald E, MD  valACYclovir (VALTREX) 1000 MG tablet Take by mouth. 06/11/10   [provider]    Allergies Patient has no known allergies.  No family history on file.  Social History Social History  Substance Use Topics  . Smoking status: Current Every Day Smoker    Packs/day: 1.00    Years: 30.00    Types: Cigarettes    Start date: 10/13/1984  . Smokeless tobacco: Never Used  . Alcohol use 0.0 oz/week    Review of Systems  Constitutional: No fever/chills Eyes: No visual changes. ENT: No sore throat. Cardiovascular: Denies chest pain. Respiratory: Denies shortness of breath. Gastrointestinal: No abdominal pain.  No nausea, no vomiting.  No diarrhea.  No constipation. Genitourinary: Negative for dysuria. Musculoskeletal: Negative for back pain. Skin: Negative for rash. Neurological: Negative for headaches, focal weakness or numbness.   ____________________________________________   PHYSICAL EXAM:  VITAL SIGNS: ED Triage Vitals  Enc Vitals Group     BP 10/28/16 0506 (!) 142/104     Pulse Rate 10/28/16 0506 100     Resp 10/28/16 0506 17     Temp 10/28/16 0506 97.8 F (36.6 C)     Temp Source 10/28/16 0506 Oral     SpO2 10/28/16 0506 98 %     Weight 10/28/16 0506 190 lb (86.2 kg)     Height 10/28/16 0506 6' (1.829 m)     Head Circumference --  Peak Flow --      Pain Score 10/28/16 0504 0     Pain Loc --      Pain Edu? --      Excl. in GC? --     Constitutional: Alert and oriented. Well appearing and in no acute distress. Eyes: Conjunctivae are normal.  Head: Atraumatic. Nose: No congestion/rhinnorhea. Mouth/Throat: Mucous membranes are moist.  Neck: No stridor.   Cardiovascular: Normal rate, regular rhythm. Grossly normal heart sounds.   Respiratory: Normal respiratory effort.  No retractions. Lungs CTAB. Gastrointestinal: Soft and nontender. No distention. Musculoskeletal: No lower extremity tenderness nor  edema.  No joint effusions. Neurologic:  Normal speech and language. No gross focal neurologic deficits are appreciated. Skin:  Skin is warm, dry and intact. No rash noted. Psychiatric: Mood and affect are normal. Speech and behavior are normal.  ____________________________________________   LABS (all labs ordered are listed, but only abnormal results are displayed)  Labs Reviewed  COMPREHENSIVE METABOLIC PANEL - Abnormal; Notable for the following:       Result Value   Glucose, Bld 104 (*)    Calcium 8.8 (*)    All other components within normal limits  CBC  ETHANOL  URINE DRUG SCREEN, QUALITATIVE ([facility] ONLY)  ACETAMINOPHEN LEVEL  SALICYLATE LEVEL   ____________________________________________  EKG  ED ECG REPORT I, Arelia Longest, the attending physician, personally viewed and interpreted this ECG.   Date: 10/28/2016  EKG Time: 0511  Rate: 93  Rhythm: normal sinus rhythm  Axis: Normal  Intervals:none  ST&T Change: Mild diffuse ST elevation with a concave morphology. Likely consistent with benign early repolarization.  ____________________________________________  RADIOLOGY   ____________________________________________   PROCEDURES  Procedure(s) performed:   Procedures  Critical Care performed:   ____________________________________________   INITIAL IMPRESSION / ASSESSMENT AND PLAN / ED COURSE  Pertinent labs & imaging results that were available during my care of the patient were reviewed by me and considered in my medical decision making (see chart for details).  Patient likely not feeling affects of the multiple doses of Ativan and Xanax at this time almost 24 hours afterwards. He will be placed under involuntary commitment and will be seen by the psychiatrist. I discussed this plan with the patient and he is understanding willing to comply.      ____________________________________________   FINAL CLINICAL IMPRESSION(S) / ED  DIAGNOSES  Suicide attempt.    NEW MEDICATIONS STARTED DURING THIS VISIT:  New Prescriptions   No medications on file     Note:  This document was prepared using Dragon voice recognition software and may include unintentional dictation errors.     Myrna Blazer, MD 10/28/16 [phone]    Myrna Blazer, MD 10/28/16 [phone]  Patient sleeping but arousable. Snoring loudly with desaturation to 89-91% on room air. Placed on nasal cannula oxygen and saturation resolved to about 96%. Patient to be transferred to the quad area of the emergency department. Pending psychiatric evaluation at this time.    Myrna Blazer, MD 10/28/16 [phone]

## 2016-10-28 NOTE — BH Assessment (Signed)
[facility] by Dr. Toni Amendlapacs.  Attending Physician will be Dr. Ardyth HarpsHernandez.   Patient [facility] room [facility] [place].   Intake Paper Work has been signed and placed on patient chart.  ER staff is aware of the admission Rivka Barbara( Glenda, ER Sect.; Dr. Lamont Snowballifenbark, ER MD; Amy, Patient's Nurse & Sharia ReeveJosh, Patient Access).

## 2016-10-28 NOTE — Progress Notes (Signed)
Pt transferred from ED to BMU.  Pt cooperative but intermittently sad, angry and tearful.  Pt reports he doesn't know why he was admitted, but confirms that he did "take a lot of pills."  Pt denies this being a suicide attempt but states, "I didn't really care if I woke up."  Pt denies history of any other suicide attempt.  Complains of 10/10 depression and stress, primarily related to wife (married x 3 years).  "She's manipulative, she accuses me of screwing everything with two legs."  Pt denies infidelity.  "She's extremely critical."  Pt reports he will go to the grocery store and she will accuse him of cheating on her if he took too long.  Pt reports wife lost her job so she has been living off her retirement, putting the majority of financial responsibility on pt.  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  Pt denies current SI/HI/AVH and contracts for safety.  Pt becomes increasingly tearful, asking to be discharged.  RN later approached pt in his room and found pt looking for ways to open the window.  Also observed pushing exit doors.  Pt reminded that this is locked unit.   Pt reports he is prescribed xanax and ativan for sleep and anxiety.  Denies any other meds.  Oriented to unit, no acute distress.  Will continue to provide emotional support to pt and monitor per MD orders.

## 2016-10-28 NOTE — BH Assessment (Signed)
Tele Assessment Note   Patrick Lucas is an 46 y.o. male presenting for assessment due to intentional prescription overdose. Pt acknowledges OD to be suicide attempt. When asked how he felt now that he has survived attempt pt states "It sucks. Because I know what's out there, heartless cold people". Pt reports a number of ongoing stressors including being forced to work 3rd shift after 20+ years on the job first shift. Pt also reports ongoing conflict with wife.   Pt denies HI. Pt reports wanting to punch his boss who placed him on 3rd shift. Pt states he may actually strike his boss if he (pt) is fired for some reason. Pt denies h/o violence/aggresion. Pt denies access to firearms/weapons. Pt reports alcohol use 1x/month. Pt denies alcohol abuse but, does report h/o inpatient admission for alcohol.   Diagnosis: Adjustment d/o  Past Medical History:  Past Medical History:  Diagnosis Date  . Allergic rhinitis   . Anxiety   . Fracture of multiple ribs, right, closed. initial encounter 10/14/2014  . Fracture of one rib of right side, sequela 10/14/2014  . Insomnia   . Sciatica 10/14/2014    History reviewed. No pertinent surgical history.  Family History: No family history on file.  Social History:  reports that he has been smoking Cigarettes.  He started smoking about 32 years ago. He has a 30.00 pack-year smoking history. He has never used smokeless tobacco. He reports that he drinks alcohol. He reports that he uses drugs.  Additional Social History:  Alcohol / Drug Use Pain Medications: Pt denies abuse. Prescriptions: Pt denies abuse. Over the Counter: Pt denies abuse. History of alcohol / drug use?: No history of alcohol / drug abuse (Pt denies abuse.)  CIWA: CIWA-Ar BP: 115/72 Pulse Rate: 80 Nausea and Vomiting: no nausea and no vomiting Tactile Disturbances: none Tremor: no tremor Auditory Disturbances: not present Paroxysmal Sweats: no sweat visible Visual Disturbances: not  present Anxiety: no anxiety, at ease Headache, Fullness in Head: none present Agitation: normal activity Orientation and Clouding of Sensorium: oriented and can do serial additions CIWA-Ar Total: 0 COWS:    PATIENT STRENGTHS: (choose at least two) Average or above average intelligence General fund of knowledge  Allergies: No Known Allergies  Home Medications:  (Not in a hospital admission)  OB/GYN Status:  No LMP for male patient.  General Assessment Data Location of Assessment: [facility] ED TTS Assessment: In system Is this a Tele or Face-to-Face Assessment?: Face-to-Face Is this an Initial Assessment or a Re-assessment for this encounter?: Initial Assessment Marital status: Married Is patient pregnant?: No Pregnancy Status: No Living Arrangements: Spouse/significant other Can pt return to current living arrangement?: Yes Admission Status: Involuntary Is patient capable of signing voluntary admission?: No Referral Source: Self/Family/Friend Insurance type: Community education officer     Crisis Care Plan Living Arrangements: Spouse/significant other Name of Psychiatrist: None Name of Therapist: None  Education Status Is patient currently in school?: No Highest grade of school patient has completed: 11  Risk to self with the past 6 months Suicidal Ideation: Yes-Currently Present Has patient been a risk to self within the past 6 months prior to admission? : No Suicidal Intent: Yes-Currently Present Has patient had any suicidal intent within the past 6 months prior to admission? : No Is patient at risk for suicide?: Yes Suicidal Plan?: Yes-Currently Present Has patient had any suicidal plan within the past 6 months prior to admission? : No Specify Current Suicidal Plan: Pt attempted to OD on prescription Rx  prior to arrival Access to Means: Yes Specify Access to Suicidal Means: Access to prescripton and OTC medications What has been your use of drugs/alcohol within the last 12 months?: Pt  reports consuminig alcoholic beverages 1x/month. Previous Attempts/Gestures: No How many times?: 0 Other Self Harm Risks: None Reported Intentional Self Injurious Behavior: None Family Suicide History: No Recent stressful life event(s): Other (Comment), Conflict (Comment) (Switched to 3rd shfit, conflict with wife) Persecutory voices/beliefs?: No Depression: Yes Depression Symptoms: Feeling angry/irritable, Insomnia, Isolating, Feeling worthless/self pity Substance abuse history and/or treatment for substance abuse?: Yes (father alcoholic) Suicide prevention information given to non-admitted patients: Not applicable  Risk to Others within the past 6 months Homicidal Ideation: No Does patient have any lifetime risk of violence toward others beyond the six months prior to admission? : No Thoughts of Harm to Others: Yes-Currently Present (My manager "he's a big asshole". Pt wants to punch him if fi) Current Homicidal Intent: No Current Homicidal Plan: No Access to Homicidal Means: No History of harm to others?: No Assessment of Violence: None Noted Does patient have access to weapons?: No Criminal Charges Pending?: No (Pending DWLR traffic charge) Does patient have a court date: No Is patient on probation?: No  Psychosis Hallucinations: None noted Delusions: None noted  Mental Status Report Appearance/Hygiene: In scrubs, Disheveled Eye Contact: Good Motor Activity: Unremarkable Speech: Logical/coherent Level of Consciousness: Alert Mood: Pleasant Affect: Other (Comment) (Mood Congruent) Anxiety Level: Minimal Thought Processes: Coherent, Relevant Judgement: Unimpaired Orientation: Person, Place, Time, Situation Obsessive Compulsive Thoughts/Behaviors: None  Cognitive Functioning Concentration: Normal Memory: Recent Intact, Remote Intact IQ: Average Insight: Fair Impulse Control: Fair Appetite: Good Weight Loss: 0 Weight Gain: 0 Sleep: No Change Total Hours of  Sleep: 4 Vegetative Symptoms: None  ADLScreening [facility] Assessment Services) Patient's cognitive ability adequate to safely complete daily activities?: Yes Patient able to express need for assistance with ADLs?: Yes Independently performs ADLs?: Yes (appropriate for developmental age)  Prior Inpatient Therapy Prior Inpatient Therapy: Yes Prior Therapy Dates: 1995 Prior Therapy Facilty/Provider(s): [facility] Reason for Treatment: Alcohol use  Prior Outpatient Therapy Prior Outpatient Therapy: No Does patient have an ACCT team?: No Does patient have Intensive In-House Services?  : No Does patient have [facility] services? : No Does patient have P4CC services?: No  ADL Screening (condition at time of admission) Patient's cognitive ability adequate to safely complete daily activities?: Yes Is the patient deaf or have difficulty hearing?: No Does the patient have difficulty seeing, even when wearing glasses/contacts?: No Does the patient have difficulty concentrating, remembering, or making decisions?: No Patient able to express need for assistance with ADLs?: Yes Does the patient have difficulty dressing or bathing?: No Independently performs ADLs?: Yes (appropriate for developmental age) Does the patient have difficulty walking or climbing stairs?: No Weakness of Legs: None Weakness of Arms/Hands: None  Home Assistive Devices/Equipment Home Assistive Devices/Equipment: None  Therapy Consults (therapy consults require a physician order) PT Evaluation Needed: No OT Evalulation Needed: No SLP Evaluation Needed: No Abuse/Neglect Assessment (Assessment to be complete while patient is alone) Physical Abuse: Denies Verbal Abuse: Denies Sexual Abuse: Denies Exploitation of patient/patient's resources: Denies Self-Neglect: Denies Values / Beliefs Cultural Requests During Hospitalization: None Spiritual Requests During Hospitalization: None Consults Spiritual Care Consult Needed:  No Social Work Consult Needed: No Merchant navy officer (For Healthcare) Does Patient Have a Medical Advance Directive?: No Would patient like information on creating a medical advance directive?: No - Patient declined    Additional Information 1:1 In Past 12  Months?: No CIRT Risk: No Elopement Risk: No Does patient have medical clearance?: Yes     Disposition:  Disposition Initial Assessment Completed for this Encounter: Yes Disposition of Patient: Inpatient treatment program Type of inpatient treatment program: Adult (Inptatient admission per Dr.Clapacs)  Brissia Delisa J [place] 10/28/2016 3:42 PM

## 2016-10-28 NOTE — ED Notes (Signed)
Dr.clapaac in room talking with patient 

## 2016-10-28 NOTE — ED Notes (Signed)
Patient asleep placed food in room

## 2016-10-28 NOTE — ED Notes (Signed)
BEHAVIORAL HEALTH ROUNDING Patient sleeping: Yes.   Patient alert and oriented: eyes closed  Appears to be asleep Behavior appropriate: Yes.  ; If no, describe:  Nutrition and fluids offered: Yes  Toileting and hygiene offered: sleeping Sitter present: q 15 minute observations and security monitoring Law enforcement present: yes  ODS 

## 2016-10-28 NOTE — BH Assessment (Signed)
2nd TTS Consult attempt- Pt sleeping. Attempts to arouse pt unsuccessful. TTS will re-attempt consult at later time.

## 2016-10-28 NOTE — BH Assessment (Signed)
Pt recommended for inpatient admission by Dr.Clapacs. Pt's chart currently under review for [facility] BMU bed assignment. 

## 2016-10-28 NOTE — ED Notes (Signed)
Patient asleep, lunch placed in room 

## 2016-10-28 NOTE — ED Notes (Signed)
Pt's mother to bedside; supervised 10 minute visit allowed prior to transfer over to quad.

## 2016-10-28 NOTE — ED Notes (Signed)
TTS attempted to assess  - he did not awaken

## 2016-10-28 NOTE — ED Notes (Signed)
I awakened him and provided him a phone call - he was talking but when tech checked he was snoring asleep and the phone remained on - within a minute I received call from a family member requesting an update - password was provided and I gave an update

## 2016-10-28 NOTE — ED Notes (Signed)
BEHAVIORAL HEALTH ROUNDING Patient sleeping: No. Patient alert and oriented: yes Behavior appropriate: Yes.  ; If no, describe:  Nutrition and fluids offered: yes Toileting and hygiene offered: Yes  Sitter present: q15 minute observations and security  monitoring Law enforcement present: Yes  ODS  

## 2016-10-28 NOTE — Consult Note (Signed)
[name] Face-to-Face Psychiatry Consult   Reason for Consult:  Consult for 46 year old man who came into the hospital with a suicide attempt Referring Physician:  Rifenbark Patient Identification: Patrick Lucas MRN:  [id number] Principal Diagnosis: Adjustment disorder with mixed disturbance of emotions and conduct Diagnosis:   Patient Active Problem List   Diagnosis Date Noted  . Suicide attempt ([place]) [T14.91XA] 10/28/2016  . Adjustment disorder with mixed disturbance of emotions and conduct [F43.25] 10/28/2016  . Benzodiazepine abuse [F13.10] 10/28/2016  . Alcohol abuse [F10.10] 10/28/2016  . Allergic rhinitis [J30.9] 10/14/2014  . Hip pain, chronic [M25.559, G89.29] 10/14/2014  . Diverticulitis [K57.92] 10/14/2014  . Otitis media of left ear [H66.92] 10/14/2014  . Genital herpes [A60.00] 11/09/2007  . Compulsive tobacco user syndrome [F17.200] 01/06/2007  . Alcohol dependence in remission ([place]) [F10.21] 11/22/2005  . Anxiety disorder [F41.9] 10/23/2005  . Insomnia [G47.00] 10/23/2005    Total Time spent with patient: 1 hour  Subjective:   Patrick Lucas is a 46 y.o. male patient admitted with "people of been out to get me" patient interviewed chart reviewed. 46 year old man came to the hospital after a suicide attempt by consuming multiple Xanax's and Ativan's on top of alcohol. He tells me that he took them one after another because he "wanted to sleep and not wake up". When asked directly about whether he wanted to kill himself he told me "I suppose she could say so". Patient is vague about how long he has been feeling bad but says there are many big stresses in his life. His job recently changed him to third shift after many years of working first shift. He also says his wife is the source of a lot of his problems and is critical and unsupportive of him. Patient admits that he was drinking. He says he drinks only intermittently. He says he normally does not abuses benzodiazepines.  Denies that he's been abusing other drugs. The only psychiatric medicines he takes are the Xanax and Ativan prescribed by his primary care doctor. Denies any hallucinations. Asked about homicidal ideation he says "I have those all the time about my coworkers" but then tells me it is a joke.  Social history: Patient lives at home with his wife. No other family or children at home. Works as a Glass blower/designer. He says he's been at the same company for over 20 years and just now has been shifted to a different shift which is upsetting to him. He claims that his marriage is in bad shape but is vague about the details.  Medical history: History of herpes some intermittent pain but no other chronic significant medical problems  Substance abuse history: Patient denies ever being treated for alcohol or drug abuse. Has a past diagnosis of alcohol abuse in remission. He tells me that he still drinks every now and then but is vague about how much or problem that has been.[name]  HPI:  Please see note above. It looks like I included the history of present illness under the subjective.  Past Psychiatric History: Patient has a history of being treated with antianxiety medicines by his primary care doctor. Previous diagnosis listed of alcohol abuse. Patient denies any previous hospitalization and denies any previous suicide attempts or violence. Denies being on any other psychiatric medicine in the past.  Risk to Self: Is patient at risk for suicide?: Yes What has been your use of drugs/alcohol within the last 12 months?: Pt reports consuminig alcoholic beverages 1x/month. How many times?: 0  Intentional Self Injurious Behavior: None Risk to Others: Homicidal Ideation: No Thoughts of Harm to Others: Yes-Currently Present (My manager "he's a big asshole". Pt wants to punch him if fi) Current Homicidal Intent: No Current Homicidal Plan: No Access to Homicidal Means: No History of harm to others?: No Assessment of  Violence: None Noted Does patient have access to weapons?: No Criminal Charges Pending?: No (Pending DWLR traffic charge) Does patient have a court date: No Prior Inpatient Therapy: Prior Inpatient Therapy: Yes Prior Therapy Dates: 1995 Prior Outpatient Therapy: Prior Outpatient Therapy: No Does patient have an ACCT team?: No Does patient have Intensive In-House Services?  : No Does patient have [facility] services? : No Does patient have P4CC services?: No  Past Medical History:  Past Medical History:  Diagnosis Date  . Allergic rhinitis   . Anxiety   . Fracture of multiple ribs, right, closed. initial encounter 10/14/2014  . Fracture of one rib of right side, sequela 10/14/2014  . Insomnia   . Sciatica 10/14/2014   History reviewed. No pertinent surgical history. Family History: No family history on file. Family Psychiatric  History: Denies any family history Social History:  History  Alcohol Use  . 0.0 oz/week     History  Drug Use    Social History   Social History  . Marital status: Married    Spouse name: N/A  . Number of children: N/A  . Years of education: GED   Occupational History  . Employed    Social History Main Topics  . Smoking status: Current Every Day Smoker    Packs/day: 1.00    Years: 30.00    Types: Cigarettes    Start date: 10/13/1984  . Smokeless tobacco: Never Used  . Alcohol use 0.0 oz/week  . Drug use: Yes  . Sexual activity: Not Asked   Other Topics Concern  . None   Social History Narrative  . None   Additional Social History:    Allergies:  No Known Allergies  Labs:  Results for orders placed or performed during the hospital encounter of 10/28/16 (from the past 48 hour(s))  Comprehensive metabolic panel     Status: Abnormal   Collection Time: 10/28/16  5:09 AM  Result Value Ref Range   Sodium 135 135 - 145 mmol/L   Potassium 3.8 3.5 - 5.1 mmol/L   Chloride 104 101 - 111 mmol/L   CO2 26 22 - 32 mmol/L   Glucose, Bld 104 (H)  65 - 99 mg/dL   BUN 18 6 - 20 mg/dL   Creatinine, Ser 0.92 0.61 - 1.24 mg/dL   Calcium 8.8 (L) 8.9 - 10.3 mg/dL   Total Protein 6.8 6.5 - 8.1 g/dL   Albumin 4.0 3.5 - 5.0 g/dL   AST 27 15 - 41 U/L   ALT 24 17 - 63 U/L   Alkaline Phosphatase 73 38 - 126 U/L   Total Bilirubin 0.5 0.3 - 1.2 mg/dL   GFR calc non Af Amer >60 >60 mL/min   GFR calc Af Amer >60 >60 mL/min    Comment: (NOTE) The eGFR has been calculated using the CKD EPI equation. This calculation has not been validated in all clinical situations. eGFR's persistently <60 mL/min signify possible Chronic Kidney Disease.    Anion gap 5 5 - 15  Ethanol     Status: None   Collection Time: 10/28/16  5:09 AM  Result Value Ref Range   Alcohol, Ethyl (B) <5 <5 mg/dL  Comment:        LOWEST DETECTABLE LIMIT FOR SERUM ALCOHOL IS 5 mg/dL FOR MEDICAL PURPOSES ONLY   cbc     Status: None   Collection Time: 10/28/16  5:09 AM  Result Value Ref Range   WBC 7.0 3.8 - 10.6 K/uL   RBC 5.02 4.40 - 5.90 MIL/uL   Hemoglobin 14.9 13.0 - 18.0 g/dL   HCT 43.2 40.0 - 52.0 %   MCV 86.1 80.0 - 100.0 fL   MCH 29.8 26.0 - 34.0 pg   MCHC 34.6 32.0 - 36.0 g/dL   RDW 13.4 11.5 - 14.5 %   Platelets 221 150 - 440 K/uL  Acetaminophen level     Status: Abnormal   Collection Time: 10/28/16  5:09 AM  Result Value Ref Range   Acetaminophen (Tylenol), Serum <10 (L) 10 - 30 ug/mL    Comment:        THERAPEUTIC CONCENTRATIONS VARY SIGNIFICANTLY. A RANGE OF 10-30 ug/mL MAY BE AN EFFECTIVE CONCENTRATION FOR MANY PATIENTS. HOWEVER, SOME ARE BEST TREATED AT CONCENTRATIONS OUTSIDE THIS RANGE. ACETAMINOPHEN CONCENTRATIONS >150 ug/mL AT 4 HOURS AFTER INGESTION AND >50 ug/mL AT 12 HOURS AFTER INGESTION ARE OFTEN ASSOCIATED WITH TOXIC REACTIONS.   Salicylate level     Status: None   Collection Time: 10/28/16  5:09 AM  Result Value Ref Range   Salicylate Lvl <8.2 2.8 - 30.0 mg/dL    No current facility-administered medications for this  encounter.    Current Outpatient Prescriptions  Medication Sig Dispense Refill  . ALPRAZolam (XANAX) 0.5 MG tablet TAKE 1/2 TO 1 TABLET BY MOUTH EVERY 4 HOURS AS NEEDED FOR ANXIETY 30 tablet 5  . LORazepam (ATIVAN) 1 MG tablet TAKE 1 TO 2 TABLETS BY MOUTH AT BEDTIME AS NEEDED ANXIETY 90 tablet 1  . traZODone (DESYREL) 50 MG tablet Take 1-2 tablets (50-100 mg total) by mouth at bedtime as needed for sleep. (Patient not taking: Reported on 10/28/2016) 180 tablet 3  . valACYclovir (VALTREX) 1000 MG tablet Take by mouth.      Musculoskeletal: Strength & Muscle Tone: within normal limits Gait & Station: unsteady Patient leans: N/A  Psychiatric Specialty Exam: Physical Exam  Nursing note and vitals reviewed. Constitutional: He appears well-developed and well-nourished.  HENT:  Head: Normocephalic and atraumatic.  Eyes: Conjunctivae are normal. Pupils are equal, round, and reactive to light.  Neck: Normal range of motion.  Cardiovascular: Regular rhythm and normal heart sounds.   Respiratory: Effort normal. No respiratory distress.  GI: Soft.  Musculoskeletal: Normal range of motion.  Neurological: He is alert.  Skin: Skin is warm and dry.  Psychiatric: His affect is blunt. His speech is delayed and tangential. He is slowed and withdrawn. Thought content is paranoid. He expresses impulsivity. He exhibits a depressed mood. He expresses suicidal ideation. He expresses suicidal plans. He exhibits abnormal recent memory.    Review of Systems  Constitutional: Negative.   HENT: Negative.   Eyes: Negative.   Respiratory: Negative.   Cardiovascular: Negative.   Gastrointestinal: Negative.   Musculoskeletal: Negative.   Skin: Negative.   Neurological: Negative.   Psychiatric/Behavioral: Positive for depression, memory loss, substance abuse and suicidal ideas. Negative for hallucinations. The patient is nervous/anxious. The patient does not have insomnia.     Blood pressure 115/72, pulse 80,  temperature 97.6 F (36.4 C), temperature source Oral, resp. rate 18, height 6' (1.829 m), weight 86.2 kg (190 lb), SpO2 99 %.Body mass index is 25.77 kg/m.  General Appearance:  Disheveled  Eye Contact:  Minimal  Speech:  Slow and Slurred  Volume:  Decreased  Mood:  Dysphoric  Affect:  Constricted  Thought Process:  Disorganized  Orientation:  Other:  Patient vaguely knew that he was in the hospital but couldn't remember the date and was extremely vague about the circumstances  Thought Content:  Paranoid Ideation and Tangential  Suicidal Thoughts:  Yes.  with intent/plan  Homicidal Thoughts:  No  Memory:  Immediate;   Fair Recent;   Poor Remote;   Fair  Judgement:  Impaired  Insight:  Shallow  Psychomotor Activity:  Decreased  Concentration:  Concentration: Poor  Recall:  Poor  Fund of Knowledge:  Poor  Language:  Fair  Akathisia:  No  Handed:  Right  AIMS (if indicated):     Assets:  Desire for Improvement Financial Resources/Insurance Housing Physical Health Resilience  ADL's:  Intact  Cognition:  Impaired,  Mild  Sleep:        Treatment Plan Summary: Daily contact with patient to assess and evaluate symptoms and progress in treatment, Medication management and Plan 46 year old man presented after what sounds like an intentional suicide attempt. Still endorsing suicidal ideation and symptoms of depression. Appears to still be somewhat intoxicated but was able to cooperate reasonably enough with the interview. Patient needs inpatient hospitalization for stabilization. Case reviewed with TTS and emergency room physician. Full set of labs will be obtained and we will plan to get him admitted to the psychiatric ward as soon as a bed is available.  Disposition: Recommend psychiatric Inpatient admission when medically cleared. Supportive therapy provided about ongoing stressors.  Alethia Berthold, MD 10/28/2016 2:26 PM

## 2016-10-28 NOTE — Progress Notes (Signed)
Pt arrived to [facility] with IV intact to left AC. Removed by this nurse without difficulty. Pt tolerated well.

## 2016-10-28 NOTE — ED Triage Notes (Signed)
Pt arrives via [facility] with c/o SI ideation without intent and OD on 7 Xanax, 9 Lorazapam, and alcoholic beverages. Pt is alert and oriented at this time in triage and in NAD.

## 2016-10-28 NOTE — ED Notes (Signed)
[facility] from [facility]   Patient assigned to appropriate care area. Awaiting psych consult  Introduced myself to pt  Patient oriented to unit/care area: Informed that, for their safety, care areas are designed for safety and monitored by security at all times; and visiting hours explained to patient. Patient verbalizes understanding, and verbal contract for safety obtained  Environment secured    1/1 bags of belongings labeled and placed in storage by Harle StanfordFelicia Tech

## 2016-10-28 NOTE — ED Notes (Signed)
Marylu LundJanet reports that the pt can go downstairs now  - I will await bed assignment  - charge reports that he must move soon [facility]

## 2016-10-28 NOTE — ED Notes (Signed)

## 2016-10-28 NOTE — Progress Notes (Signed)
CH. gave spiritual and emotional support to Pt.

## 2016-10-28 NOTE — ED Notes (Signed)
Pt belongings bagged up prior to my shift.  Belongings bag labeled and handed off to Amy, RN upon transfer to quad.

## 2016-10-29 ENCOUNTER — Encounter: Payer: Self-pay | Admitting: Psychiatry

## 2016-10-29 DIAGNOSIS — F1021 Alcohol dependence, in remission: Secondary | ICD-10-CM

## 2016-10-29 DIAGNOSIS — F17200 Nicotine dependence, unspecified, uncomplicated: Secondary | ICD-10-CM

## 2016-10-29 DIAGNOSIS — F322 Major depressive disorder, single episode, severe without psychotic features: Principal | ICD-10-CM

## 2016-10-29 DIAGNOSIS — I10 Essential (primary) hypertension: Secondary | ICD-10-CM

## 2016-10-29 LAB — LIPID PANEL
CHOL/HDL RATIO: 5.4 ratio
CHOLESTEROL: 225 mg/dL — AB (ref 0–200)
HDL: 42 mg/dL (ref >40)
LDL Cholesterol: 119 mg/dL — ABNORMAL HIGH (ref 0–99)
TRIGLYCERIDES: 318 mg/dL — AB (ref <150)
VLDL: 64 mg/dL — ABNORMAL HIGH (ref 0–40)

## 2016-10-29 LAB — TSH: TSH: 0.514 u[IU]/mL (ref 0.350–4.500)

## 2016-10-29 MED ORDER — NICOTINE 21 MG/24HR TD PT24
21.0000 mg | MEDICATED_PATCH | Freq: Every day | TRANSDERMAL | Status: DC
  Administered 2016-10-29 – 2016-10-30 (×2): 21 mg via TRANSDERMAL
  Filled 2016-10-29 (×3): qty 1

## 2016-10-29 MED ORDER — FLUOXETINE HCL 10 MG PO CAPS
10.0000 mg | ORAL_CAPSULE | Freq: Every day | ORAL | Status: DC
  Administered 2016-10-29 – 2016-11-01 (×4): 10 mg via ORAL
  Filled 2016-10-29 (×3): qty 1

## 2016-10-29 MED ORDER — ATENOLOL 25 MG PO TABS
12.5000 mg | ORAL_TABLET | Freq: Every day | ORAL | Status: DC
  Administered 2016-10-29 – 2016-11-01 (×4): 12.5 mg via ORAL
  Filled 2016-10-29 (×4): qty 0.5

## 2016-10-29 MED ORDER — TRAZODONE HCL 50 MG PO TABS
150.0000 mg | ORAL_TABLET | Freq: Every day | ORAL | Status: DC
  Administered 2016-10-29 – 2016-10-31 (×3): 150 mg via ORAL
  Filled 2016-10-29 (×3): qty 1

## 2016-10-29 MED ORDER — CHLORDIAZEPOXIDE HCL 25 MG PO CAPS: 50.0000 mg | ORAL_CAPSULE | Freq: Three times a day (TID) | ORAL | Status: DC

## 2016-10-29 NOTE — Progress Notes (Signed)
Recreation Therapy Notes  INPATIENT RECREATION THERAPY ASSESSMENT  Patient Details Name: Patrick Lucas J Naves MRN: [id number] DOB: 10/07/1970 Today's Date: 10/29/2016  Patient Stressors: Relationship, Other (Comment) (Issues with wife - they are not getting along; "Everything")  Coping Skills:   Isolate, Arguments, Avoidance, Talking, Music, Sports, Other (Comment) Biochemist, clinical(Gardening)  Personal Challenges: Expressing Yourself, Relationships  Leisure Interests (2+):  [employer]oco[employer] - Fishing, Sports - Swimming, Individual - Other (Comment) (Garden, Financial risk analystcook)  Biochemist, clinicalAwareness of Community Resources:  Yes  Community Resources:  Park, Other (Comment) Set designer([facility])  Current Use: No  If no, Barriers?: Other (Comment) (Too busy)  Patient Strengths:  Honest, personality  Patient Identified Areas of Improvement:  "I don't know"  Current Recreation Participation:  Gardening  Patient Goal for Hospitalization:  To get out  [place]idence[place]  County of Residence:  [place]   Current SI (including self-harm):  No  Current HI:  No  Consent to Intern Participation: N/A  LRT shared goals with patient. Patient stated he felt good and did not want to work on the goals at this time. Due to patient refusal, LRT will not develop a Recreational Therapy Care Plan at this time. If patient's status changes, LRT will develop a Recreational Therapy Care Plan.  Jacquelynn CreeGreene,Dominie Benedick M, LRT/CTRS 10/29/2016, 3:54 PM

## 2016-10-29 NOTE — Progress Notes (Signed)
Patient ID: Patrick Lucas, male   DOB: 10/19/1970, 46 y.o.   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

## 2016-10-29 NOTE — Plan of Care (Signed)
Problem: Ineffective Coping Goal: Denies thoughts of self harm Outcome: Not Progressing Working on Pharmacologistcoping skills

## 2016-10-29 NOTE — Progress Notes (Signed)
D D: Patient stated slept fair last night .Stated appetite is good and energy level  Is normal. Stated concentration is good . Stated on Depression scale 4, hopeless 3 and anxiety 3 .( low 0-10 high) Denies suicidal  homicidal ideations  .  No auditory hallucinations  No pain concerns . Appropriate ADL'S. Interacting with peers and staff.  Patient focused is on going home  A: Encourage patient participation with unit programming . Instruction  Given on  Medication , verbalize understanding. R: Voice no other concerns. Staff continue to monitor

## 2016-10-29 NOTE — Plan of Care (Signed)
Problem: Coping: Goal: Ability to identify and develop effective coping behavior will improve Outcome: Not Progressing Encourage patient to attend groups

## 2016-10-29 NOTE — [facility] Group Notes (Signed)
Goals Group  Date/Time: 10/29/2016; 9:00 AM Type of Therapy and Topic: Group Therapy: Goals Group: SMART Goals  ?  Participation Level: Moderate  ?  Description of Group:  ?  The purpose of a daily goals group is to assist and guide patients in setting recovery/wellness-related goals. The objective is to set goals as they relate to the crisis in which they were admitted. Patients will be using SMART goal modalities to set measurable goals. Characteristics of realistic goals will be discussed and patients will be assisted in setting and processing how one will reach their goal. Facilitator will also assist patients in applying interventions and coping skills learned in psycho-education groups to the SMART goal and process how one will achieve defined goal.  ?  Therapeutic Goals:  ?  -Patients will develop and document one goal related to or their crisis in which brought them into treatment.  -Patients will be guided by LCSW using SMART goal setting modality in how to set a measurable, attainable, realistic and time sensitive goal.  -Patients will process barriers in reaching goal.  -Patients will process interventions in how to overcome and successful in reaching goal.  ?  Patient's Goal: Patient stated that his goal is to "take it day by day." ?  Therapeutic Modalities:  Motivational Interviewing  Cognitive Behavioral Therapy  Crisis Intervention Model  SMART goals setting  Hampton AbbotKadijah Chalet Kerwin, MSW, LCSW-A 10/29/2016, 11:35AM

## 2016-10-29 NOTE — [facility] Suicide Risk Assessment (Signed)
[facility] INPATIENT:  Family/Significant Other Suicide Prevention Education  Suicide Prevention Education:  Education Completed; wife, Saunders GlanceLynn Dibert ph#: [phone] has been identified by the patient as the family member/significant other with whom the patient will be residing, and identified as the person(s) who will aid the patient in the event of a mental health crisis (suicidal ideations/suicide attempt).  With written consent from the patient, the family member/significant other has been provided the following suicide prevention education, prior to the and/or following the discharge of the patient.  The suicide prevention education provided includes the following:  Suicide risk factors  Suicide prevention and interventions  National Suicide Hotline telephone number  [facility] assessment telephone number  [facility] Emergency Assistance 911  [facility] and/or Residential Mobile Crisis Unit telephone number  Request made of family/significant other to:  Remove weapons (e.g., guns, rifles, knives), all items previously/currently identified as safety concern.    Remove drugs/medications (over-the-counter, prescriptions, illicit drugs), all items previously/currently identified as a safety concern.  The family member/significant other verbalizes understanding of the suicide prevention education information provided.  The family member/significant other agrees to remove the items of safety concern listed above.  Lynden OxfordKadijah R Makenli Derstine, MSW, LCSW-A 10/29/2016, 3:04 PM

## 2016-10-29 NOTE — Plan of Care (Signed)
Problem: Activity: Goal: Sleeping patterns will improve Outcome: Progressing Patient slept for Estimated Hours of 7.45; q15 minutes safety round maintained, no injury or falls during this shift.    

## 2016-10-29 NOTE — Plan of Care (Signed)
Problem: Education: Goal: Knowledge of [facility] Education information/materials will improve Outcome: Progressing Patient able to verbalize educational information given at admission

## 2016-10-29 NOTE — [facility] Group Notes (Signed)
[facility] LCSW Group Therapy Note  Date/Time:10/29/16, 3pm  Type of Therapy/Topic:  Group Therapy:  Feelings about Diagnosis  Participation Level:  Active   Mood: Depressed,   Description of Group:    This group will allow patients to explore their thoughts and feelings about diagnoses they have received. Patients will be guided to explore their level of understanding and acceptance of these diagnoses. Facilitator will encourage patients to process their thoughts and feelings about the reactions of others to their diagnosis, and will guide patients in identifying ways to discuss their diagnosis with significant others in their lives. This group will be process-oriented, with patients participating in exploration of their own experiences as well as giving and receiving support and challenge from other group members.   Therapeutic Goals: 1. Patient will demonstrate understanding of diagnosis as evidence by identifying two or more symptoms of the disorder:  2. Patient will be able to express two feelings regarding the diagnosis 3. Patient will demonstrate ability to communicate their needs through discussion and/or role plays  Summary of Patient Progress:  Pt able to meet above therapeutic goals, shared about his relationship with his wife, some of what contributed to his recent overdose.  Is tearful at times when discussing possibility that he may need to end his marriage.  CSW encouraged Pt to focus on getting well, not to make any major decisions right away.  Pt verbalizes he wants to leave, CSW asks him to use this time to focus on wellness, not on what he is missing or unable to do while here.   Therapeutic Modalities:   Cognitive Behavioral Therapy Brief Therapy Feelings Identification     Glennon MacSara P Capri Veals, LCSW 10/29/2016, 4:46 PM

## 2016-10-29 NOTE — Progress Notes (Signed)
Recreation Therapy Notes  Date: 07.03.18 Time: 9:30 am Location: Craft Room  Group Topic: Goal Setting  Goal Area(s) Addresses:  Patient will identify at least one goal. Patient will identify at least one obstacle.  Behavioral Response: Did not attend  Intervention: Recovery Goal Chart  Activity: Patients were instructed to make a Recovery Goal Chart including goals, obstacles, the date they started working on their goals, and the date they achieved their goals.  Education: LRT educated patients on healthy ways to celebrate achieving their goals.  Education Outcome: Patient did not attend group.  Clinical Observations/Feedback: Patient did not attend group.  Jacquelynn CreeGreene,Shareta Fishbaugh M, LRT/CTRS 10/29/2016 10:23 AM

## 2016-10-29 NOTE — Plan of Care (Signed)
Problem: Education: Goal: Verbalization of understanding the information provided will improve Outcome: Not Progressing Discussion of [place]  Process  Verbalize understanding

## 2016-10-29 NOTE — Plan of Care (Signed)
Problem: Education: Goal: Mental status will improve Outcome: Not Progressing New admission encourage groups on unit

## 2016-10-29 NOTE — Plan of Care (Signed)
Problem: Education: Goal: Emotional status will improve Outcome: Not Progressing Encourage patient  Participation with groups

## 2016-10-29 NOTE — Plan of Care (Signed)
Problem: Health Behavior/Discharge Planning: Goal: Identification of resources available to assist in meeting health care needs will improve Outcome: Not Progressing New Admission, staff will assit  need for follow up and resources avaliable

## 2016-10-29 NOTE — [facility] Suicide Risk Assessment (Signed)
[facility]   Nursing information obtained from:  Patient Demographic factors:  Male, Caucasian Current Mental Status:  NA Loss Factors:  Financial problems / change in socioeconomic status, Loss of significant relationship (marital problems) Historical Factors:  Prior suicide attempts Risk Reduction Factors:  Living with another person, especially a relative  Total Time spent with patient:  Principal Problem: MDD (major depressive disorder), single episode, severe (HCC) Diagnosis:   Patient Active Problem List   Diagnosis Date Noted  . Tobacco use disorder [F17.200] 10/29/2016  . Alcohol use disorder, severe, in sustained remission (HCC) [F10.21] 10/29/2016  . MDD (major depressive disorder), single episode, severe (HCC) [F32.2] 10/29/2016  . HTN (hypertension) [I10] 10/29/2016  . Hip pain, chronic [M25.559, G89.29] 10/14/2014   Subjective Data:   Continued Clinical Symptoms:  Alcohol Use Disorder Identification Test Final Score (AUDIT): 2 The "Alcohol Use Disorders Identification Test", Guidelines for Use in Primary Care, Second Edition.  World Science writerHealth Organization [facility](WHO). Score between 0-7:  no or low risk or alcohol related problems. Score between 8-15:  moderate risk of alcohol related problems. Score between 16-19:  high risk of alcohol related problems. Score 20 or above:  warrants further diagnostic evaluation for alcohol dependence and treatment.   CLINICAL FACTORS:   Depression:   Impulsivity Insomnia Severe More than one psychiatric diagnosis   Psychiatric Specialty Exam: Physical Exam  ROS  Blood pressure (!) 125/92, pulse (!) 102, temperature 98 F (36.7 C), resp. rate 18, height 6\' 1"  (1.854 m), weight 92.1 kg (203 lb), SpO2 100 %.Body mass index is 26.78 kg/m.                                                    Sleep:  Number of Hours: 7.45      COGNITIVE FEATURES THAT CONTRIBUTE TO RISK:  Polarized  thinking    SUICIDE RISK:   Moderate:  Frequent suicidal ideation with limited intensity, and duration, some specificity in terms of plans, no associated intent, good self-control, limited dysphoria/symptomatology, some risk factors present, and identifiable protective factors, including available and accessible social support.  [facility]  I certify that inpatient services furnished can reasonably be expected to improve the patient's condition.   Jimmy FootmanHernandez-Gonzalez,  Jenalyn Girdner, MD 10/29/2016, 11:26 AM

## 2016-10-29 NOTE — BHH Counselor (Signed)
Adult Comprehensive Assessment  Patient ID: Patrick Lucas, male   DOB: 1971/04/28, 46 y.o.   MRN: [id number]  Information Source: Information source: Patient  Current Stressors:  Educational / Learning stressors: No stressors identified  Employment / Job issues: Stress from current employer  Family Relationships: Strained relationship with wife  Surveyor, quantity / Lack of resources (include bankruptcy): No stressors identified  Housing / Lack of housing: No stressors identified  Physical health (include injuries & life threatening diseases): No stressors identified  Social relationships: No stressors identified  Substance abuse: Reports some alcohol consumptiom Bereavement / Loss: No stressors identified   Living/Environment/Situation:  Living Arrangements: Spouse/significant other Living conditions (as described by patient or guardian): A lot of arguments with wife recently  How long has patient lived in current situation?: Years  What is atmosphere in current home: Comfortable (Sometimes stressful)  Family History:  Marital status: Married Number of Years Married: 3 What types of issues is patient dealing with in the relationship?: Trust issues  What is your sexual orientation?: Heterosexual  Has your sexual activity been affected by drugs, alcohol, medication, or emotional stress?: N/A Does patient have children?: Yes How many children?: 1 How is patient's relationship with their children?: One son live out of state - does not have much contact with son   Childhood History:  By whom was/is the patient raised?: Both parents, Grandparents Description of patient's relationship with caregiver when they were a child: Good relationship Patient's description of current relationship with people who raised him/her: Has a good relationship with mother How were you disciplined when you got in trouble as a child/adolescent?: Punishment  Does patient have siblings?: Yes Number of Siblings:  2 Description of patient's current relationship with siblings: One older brother and one older sister - does not Did patient suffer any verbal/emotional/physical/sexual abuse as a child?: No Did patient suffer from severe childhood neglect?: No Has patient ever been sexually abused/assaulted/raped as an adolescent or adult?: No Was the patient ever a victim of a crime or a disaster?: No Witnessed domestic violence?: No Has patient been effected by domestic violence as an adult?: No  Education:  Highest grade of school patient has completed: GED  Currently a Consulting civil engineer?: No Learning disability?:  (N/A)  Employment/Work Situation:   Employment situation: Employed Where is patient currently employed?: [employer]  How long has patient been employed?: 23 years What is the longest time patient has a held a job?: 23 years Where was the patient employed at that time?: [employer] Has patient ever been in the [employer]?: No Has patient ever served in combat?: No Did You Receive Any Psychiatric Treatment/Services While in the [employer]?: No  Financial Resources:   Financial resources: Income from employment Does patient have a representative payee or guardian?: No  Alcohol/Substance Abuse:   If attempted suicide, did drugs/alcohol play a role in this?: Yes Alcohol/Substance Abuse Treatment Hx: Denies past history  Social Support System:   Forensic psychologist System: Fair Museum/gallery exhibitions officer System: Has some support; has not attended church in awhile  Type of faith/religion: Christianity  How does patient's faith help to cope with current illness?: Prayer   Leisure/Recreation:   Leisure and Hobbies: Gardening, fishing   Strengths/Needs:   What things does the patient do well?: Pt does not identify any strengths at this time  In what areas does patient struggle / problems for patient: Communication   Discharge Plan:   Does patient have access to transportation?:  Yes Will  patient be returning to same living situation after discharge?: Yes Currently receiving community mental health services: No If no, would patient like referral for services when discharged?: Yes (What county?) [place] cabin [place] Does patient have financial barriers related to discharge medications?: No  Summary/Recommendations:   Summary and Recommendations (to be completed by the evaluator): Patient presented to the hospital under IVC and was admitted for suicidal ideation and suicidal attempt.  Pt's primary diagnosis is major depressive disorder. Pt reports primary triggers for admission were constant arguments with wife and work stress.  Pt reports his current stressors are what led to his suicidal attempt.  Pt now denies SI/HI/AVH.  [place]  Pt lists supports in the community as his wife and mother. Patient will benefit from crisis stabilization, medication evaluation, group therapy, and psycho education in addition to case management for discharge planning. Patient and CSW reviewed pt's identified goals and treatment plan. Pt verbalized understanding and agreed to treatment plan.  At discharge it is recommended that patient remain compliant with established plan and continue treatment.  Lynden OxfordKadijah R Griselda Tosh, MSW, LCSW-A  10/29/2016

## 2016-10-29 NOTE — H&P (Signed)
     Psychiatric Specialty Exam: Physical Exam  ROS   Physician Treatment Plan for Primary Diagnosis: MDD (major depressive disorder), single episode, severe (HCC) Long Term Goal(s): Improvement in symptoms so as ready for discharge  Short Term Goals: Ability to identify changes in lifestyle to reduce recurrence of condition will improve, Ability to verbalize feelings will improve, Ability to disclose and discuss suicidal ideas, Ability to demonstrate self-control will improve and Ability to maintain clinical measurements within normal limits will improve  Physician Treatment Plan for Secondary Diagnosis: Principal Problem:   MDD (major depressive disorder), single episode, severe (HCC) Active Problems:   Hip pain, chronic   Tobacco use disorder   Alcohol use disorder, severe, in sustained remission (HCC)   HTN (hypertension)  Long Term Goal(s): Improvement in symptoms so as ready for discharge  Short Term Goals: Ability to identify triggers associated with substance abuse/mental health issues will improve  I certify that inpatient services furnished can reasonably be expected to improve the patient's condition.    Jimmy FootmanHernandez-Gonzalez,  Jonanthan Bolender, MD 7/3/20184:10 PM

## 2016-10-29 NOTE — [facility] Suicide Risk Assessment (Signed)
[facility] INPATIENT:  Family/Significant Other Suicide Prevention Education  Suicide Prevention Education:  Contact Attempts: wife, Saunders GlanceLynn Huffstetler ph#: [phone] has been identified by the patient as the family member/significant other with whom the patient will be residing, and identified as the person(s) who will aid the patient in the event of a mental health crisis.  With written consent from the patient, two attempts were made to provide suicide prevention education, prior to and/or following the patient's discharge.  We were unsuccessful in providing suicide prevention education.  A suicide education pamphlet was given to the patient to share with family/significant other.  Date and time of first attempt: 10/29/2016 @ 11:12AM   Lynden OxfordKadijah R Sarim Lucas, MSW, LCSW-A 10/29/2016, 11:13 AM

## 2016-10-29 NOTE — Plan of Care (Signed)
Problem: Education: Goal: Emotional status will improve Outcome: Not Progressing New Admission  Allowing patient verbalization of feeling to staff

## 2016-10-29 NOTE — [facility] Group Notes (Signed)
[facility] LCSW Group Therapy Note  Date/Time: 10/29/2016; 9:30 AM  Type of Therapy/Topic:  Group Therapy:  Feelings about Diagnosis  Participation Level:  Did Not Attend     Therapeutic Modalities:   Cognitive Behavioral Therapy Brief Therapy Feelings Identification   Hampton AbbotKadijah Herberth Deharo, MSW, LCSW-A

## 2016-10-29 NOTE — H&P (Addendum)
Psychiatric Admission Assessment Adult  Patient Identification: Patrick Lucas MRN:  [id number] Date of Evaluation:  10/29/2016 Chief Complaint:  suicidality Principal Diagnosis: MDD (major depressive disorder), single episode, severe (HCC) Diagnosis:   Patient Active Problem List   Diagnosis Date Noted  . Tobacco use disorder [F17.200] 10/29/2016  . Alcohol use disorder, severe, in sustained remission (HCC) [F10.21] 10/29/2016  . MDD (major depressive disorder), single episode, severe (HCC) [F32.2] 10/29/2016  . HTN (hypertension) [I10] 10/29/2016  . Hip pain, chronic [M25.559, G89.29] 10/14/2014   History of Present Illness:   [name] is  A 46 yo married,  caucasian male, with no prior psychiatric history,  who was admitted to the behavioral inpatient unit after a suicide attempt by overdose on xanax, ativan and alcohol.    He reports that his depression began over 6 months ago and that the suicidal thoughts had been in the back of his head for a while, until last night when he "just gave up," drank 2-3 beers and took 7 xanax and 9 ativans.  He believes his wife is the one who called 911.  He states that him and his wife have "not been getting along" and that his wife "doesn't trust him."  He reports that this began right around the time that he started getting depressed.  In addition, he has been experiencing stress at work.  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  His PCP had prescribed ativan, xanax, and trazadone prn to help him sleep. The pt reports that he takes the ativan about 3X/week, and the xanax about 2 x/ wk.  He reports that his concentration has been 'okay" and his appetite has been fine. He denies any homicidal ideation, anxiety, delusions, hallucinations, or symptoms of mania.  The patient reports one prior episode of depression that happened years ago, and  he was able to work through it himself by "taking it one day at a time." He has never been diagnosed with depression or any other mental illness.  He has a prior history of alcohol abuse but he has been sober since 2005.[name]. He denies any other substance abuse.  He admits to smoking a ppd.    He reports a history of head trauma at age 46, denies hx of seizures or any past suicide attempts.  He denies any hx of child abuse or domestic violence  Today, he reports no suicidal thoughts and his happy that the suicidal attempt did not work.  He is positive and is willing to seek marriage counseling with his wife.   Associated Signs/Symptoms: Depression Symptoms:  depressed mood, suicidal thoughts without plan, suicidal thoughts with specific plan, suicidal attempt, (Hypo) Manic Symptoms:  none present Anxiety Symptoms:  none present Psychotic Symptoms:  none present PTSD Symptoms: none present Total Time spent with patient: 1 hour  Past Psychiatric History: Pt reports one prior episode of depression that happened years ago, has never been diagnosed with a mental illness and has never been on any psychotropics other than xanax and ativan.  Is the patient at risk to self? Yes.    Has the patient been a risk to self in the past 6 months? No.  Has the patient been a risk to self within the distant past? No.  Is the patient a risk to others? No.  Has the patient been a risk to others  in the past 6 months? No.  Has the patient been a risk to others within the distant past? No.   Prior Inpatient Therapy:  patient has never received inpt therapy Prior Outpatient Therapy:  pt has never received outpt therapy  Alcohol Screening: 1. How often do you have a drink containing alcohol?: 2 to 4 times a month 2. How many drinks containing alcohol do you have on a typical day when you are drinking?: 1 or 2 3. How often do you have six or more drinks on one occasion?: Never Preliminary Score: 0 9. Have you or  someone else been injured as a result of your drinking?: No 10. Has a relative or friend or a doctor or another health worker been concerned about your drinking or suggested you cut down?: No Alcohol Use Disorder Identification Test Final Score (AUDIT): 2 Brief Intervention: AUDIT score less than 7 or less-screening does not suggest unhealthy drinking-brief intervention not indicated Substance Abuse History in the last 12 months:  No. Consequences of Substance Abuse: no withdrawals Previous Psychotropic Medications: No  Psychological Evaluations: No   Past Medical History:  Past Medical History:  Diagnosis Date  . Allergic rhinitis   . Anxiety   . Fracture of multiple ribs, right, closed. initial encounter 10/14/2014  . Fracture of one rib of right side, sequela 10/14/2014  . Insomnia   . Sciatica 10/14/2014   History reviewed. No pertinent surgical history.   Family History: History reviewed. No pertinent family history.   Family Psychiatric  History: no family history of mental illnesses or substance abuse.  Tobacco Screening: Have you used any form of tobacco in the last 30 days? 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  He has an older brother and sister that he has some contact with, and is in touch with his mother.  He has one son from a previous relationship that he speaks to about once per month. He found out that he had a son just a couple of years ago when his son was actually 20 years.  Patient has been married twice. He has been with his  current wife for 3 years.. History  Alcohol Use  . 1.2 oz/week  . 2 Cans of beer per week     History  Drug Use  . Types: Benzodiazepines    Additional Social History: Marital status: Married Number of Years Married: 3 What types of issues is patient dealing with in the relationship?: Trust issues  What is your sexual orientation?: Heterosexual  Has your sexual activity been affected by drugs, alcohol, medication, or emotional stress?: N/A Does patient have children?: Yes How many children?: 1 How is patient's relationship with their children?: One son live out of state - does not have much contact with son      Allergies:  No Known Allergies   Lab Results:  Results for orders placed or performed during the hospital encounter of 10/28/16 (from the past 48 hour(s))  Lipid panel     Status: Abnormal   Collection Time: 10/29/16  6:28 AM  Result Value Ref Range   Cholesterol  225 (H) 0 - 200 mg/dL   Triglycerides 161 (H) <150 mg/dL   HDL 42 >09 mg/dL   Total CHOL/HDL Ratio 5.4 RATIO   VLDL 64 (H) 0 - 40 mg/dL   LDL Cholesterol 604 (H) 0 - 99 mg/dL    Comment:        Total Cholesterol/HDL:CHD Risk Coronary Heart Disease Risk Table                     Men   Women  1/2 Average Risk   3.4   3.3  Average Risk       5.0   4.4  2 X Average Risk   9.6   7.1  3 X Average Risk  23.4   11.0        Use the calculated Patient Ratio above and the CHD Risk Table to determine the patient's CHD Risk.        ATP III CLASSIFICATION (LDL):  <100     mg/dL   Optimal  540-981  mg/dL   Near or Above                    Optimal  130-159  mg/dL   Borderline  191-478  mg/dL   High  >295     mg/dL   Very High   TSH     Status: None   Collection Time: 10/29/16  6:28 AM  Result Value Ref Range   TSH 0.514 0.350 - 4.500 uIU/mL    Comment: Performed by a 3rd Generation assay with a functional sensitivity of <=0.01 uIU/mL.    Blood Alcohol level:  Lab Results  Component Value Date   ETH <5  10/28/2016    Metabolic Disorder Labs:  No results found for: HGBA1C, MPG No results found for: PROLACTIN Lab Results  Component Value Date   CHOL 225 (H) 10/29/2016   TRIG 318 (H) 10/29/2016   HDL 42 10/29/2016   CHOLHDL 5.4 10/29/2016   VLDL 64 (H) 10/29/2016   LDLCALC 119 (H) 10/29/2016    Current Medications: Current Facility-Administered Medications  Medication Dose Route Frequency Provider Last Rate Last Dose  . acetaminophen (TYLENOL) tablet 650 mg  650 mg Oral Q6H PRN Clapacs, John T, MD      . alum & mag hydroxide-simeth (MAALOX/MYLANTA) 200-200-20 MG/5ML suspension 30 mL  30 mL Oral Q4H PRN Clapacs, John T, MD      . atenolol (TENORMIN) tablet 12.5 mg  12.5 mg Oral Daily Jimmy Footman, MD      . FLUoxetine (PROZAC) capsule 10 mg  10 mg Oral Daily Jimmy Footman, MD      . magnesium hydroxide (MILK OF MAGNESIA) suspension 30 mL  30 mL Oral Daily PRN Clapacs, John T, MD      . nicotine (NICODERM CQ - dosed in mg/24 hours) patch 21 mg  21 mg Transdermal Daily Jimmy Footman, MD      . traZODone (DESYREL) tablet 150 mg  150 mg Oral QHS Jimmy Footman, MD       PTA Medications: Prescriptions Prior to Admission  Medication Sig Dispense Refill Last Dose  . ALPRAZolam (XANAX) 0.5 MG tablet TAKE 1/2 TO 1 TABLET BY MOUTH EVERY 4 HOURS AS NEEDED FOR ANXIETY 30 tablet 5 unknown at unknown  . LORazepam (ATIVAN) 1 MG tablet TAKE 1 TO 2 TABLETS BY MOUTH AT BEDTIME AS NEEDED ANXIETY 90 tablet 1 unknown at unknown  . traZODone (DESYREL) 50 MG tablet  Take 1-2 tablets (50-100 mg total) by mouth at bedtime as needed for sleep. (Patient not taking: Reported on 10/28/2016) 180 tablet 3 Not Taking at Unknown time  . valACYclovir (VALTREX) 1000 MG tablet Take by mouth.   unknown at unknown    Musculoskeletal: Strength & Muscle Tone: within normal limits Gait & Station: normal Patient leans: N/A  Psychiatric Specialty Exam: Physical Exam   Vitals reviewed. Constitutional: He is oriented to person, place, and time. He appears well-developed and well-nourished.  Respiratory: Effort normal and breath sounds normal.  Neurological: He is alert and oriented to person, place, and time.  Skin: Skin is warm and dry.  Psychiatric: His speech is normal and behavior is normal. Thought content normal. His mood appears not anxious. Thought content is not delusional. Cognition and memory are normal. He exhibits a depressed mood. He expresses no homicidal and no suicidal ideation.    Review of Systems  Constitutional: Negative for chills, diaphoresis and fever.  Respiratory: Negative for cough and shortness of breath.   Cardiovascular: Negative for chest pain and palpitations.  Gastrointestinal: Negative for abdominal pain, nausea and vomiting.  Genitourinary: Negative.   Musculoskeletal: Negative.   Neurological: Negative for dizziness and headaches.  Endo/Heme/Allergies: Negative.   Psychiatric/Behavioral: Positive for depression. Negative for hallucinations and suicidal ideas. The patient is not nervous/anxious.     Blood pressure (!) 125/92, pulse (!) 102, temperature 98 F (36.7 C), resp. rate 18, height 6\' 1"  (1.854 m), weight 92.1 kg (203 lb), SpO2 100 %.Body mass index is 26.78 kg/m.  General Appearance: Disheveled  Eye Contact:  Good  Speech:  Normal Rate  Volume:  Normal  Mood:  Dysphoric  Affect:  Blunt  Thought Process:  Coherent and Descriptions of Associations: Intact  Orientation:  Full (Time, Place, and Person)  Thought Content:  Hallucinations: None  Suicidal Thoughts:  No  Homicidal Thoughts:  No  Memory:  Immediate;   Good Recent;   Good Remote;   Good  Judgement:  Poor  Insight:  Fair  Psychomotor Activity:  Decreased  Concentration:  Concentration: Good and Attention Span: Good  Recall:  Good  Fund of Knowledge:  Good  Language:  Good  Akathisia:  No  Handed:    AIMS (if indicated):     Assets:   Communication Skills Desire for Improvement Financial Resources/Insurance Housing Vocational/Educational  ADL's:  Intact  Cognition:  WNL  Sleep:  Number of Hours: 7.45    Treatment Plan Summary:  Pt is a 46 yo caucasian male who presents with depression and suicide attempt by xanax and ativan overdose.  Major Depressive Disorder with suicide attempt:  Will start pt on prozac 10 mg po qd.  Social worker is looking into setting up outpatient care with therapy and psychiatrist for pt upon discharge.   Insomnia: Start trazodone 150 mg po qPM for sleep.  Benzodiazepine overdose: no withdrawals present. Continue to monitor patient. No history of abuse  Alcohol use disorder: Patient is states that he has been sober from alcoholism since 2005. He drank a couple beers the night of the OD  Elevated blood pressure and heart rate: start atenolol 12.5 mg po qd, continue to monitor  Tobacco Cessation: will start Nicotine patch of 21 mg  Diet: low sodium  Precautionary checks every 15 mins  VS daily  Labs: TSH 0.514 Total Cholesterol 225 HDL 42 LDL 119 Hgb A1C pending   Disposition: will continue to monitor patient daily.  Social worker is looking for  outpt therapy upon discharge.  Anticipate a 3-5 day inpatient course.  Collateral information: We attempted to contact the patient's wife but there was no response  Patient has denied having any access to guns.   I certify that inpatient services furnished can reasonably be expected to improve the patient's condition.    Jimmy Footman, MD 7/3/201812:21 PM

## 2016-10-29 NOTE — Plan of Care (Signed)
Problem: Activity: Goal: Sleeping patterns will improve Outcome: Progressing Staff assist with documentation of sleep hours, patient able to verbalize how he slept and have conversation with MD

## 2016-10-30 LAB — HEMOGLOBIN A1C
Hgb A1c MFr Bld: 5.6 % (ref 4.8–5.6)
Mean Plasma Glucose: 114 mg/dL

## 2016-10-30 NOTE — Progress Notes (Signed)
D: Patient stated slept fair   last night .Stated appetite is good and energy level  Is normal. Stated concentration is good . Stated on Depression scale 3 , hopeless 0 and anxiety 0 .( low 0-10 high) Denies suicidal  homicidal ideations  .  No auditory hallucinations  No pain concerns . Appropriate ADL'S. Interacting with peers and staff. Working on coping  Situations  A: Encourage patient participation with unit programming . Instruction  Given on  Medication , verbalize understanding. R: Voice no other concerns. Staff continue to monitor

## 2016-10-30 NOTE — Plan of Care (Signed)
Problem: Activity: Goal: Sleeping patterns will improve Outcome: Progressing Patient slept for Estimated Hours of 6.45; q15 minutes safety round maintained, no injury or falls during this shift.    

## 2016-10-30 NOTE — Tx Team (Signed)
Interdisciplinary Treatment and Diagnostic Plan Update  10/30/2016 Time of Session: 11:00 AM Patrick Lucas MRN: [id number]  Principal Diagnosis: MDD (major depressive disorder), single episode, severe (HCC)  Secondary Diagnoses: Principal Problem:   MDD (major depressive disorder), single episode, severe (HCC) Active Problems:   Hip pain, chronic   Tobacco use disorder   Alcohol use disorder, severe, in sustained remission (HCC)   HTN (hypertension)   Current Medications:  Current Facility-Administered Medications  Medication Dose Route Frequency Provider Last Rate Last Dose  . acetaminophen (TYLENOL) tablet 650 mg  650 mg Oral Q6H PRN Clapacs, John T, MD      . alum & mag hydroxide-simeth (MAALOX/MYLANTA) 200-200-20 MG/5ML suspension 30 mL  30 mL Oral Q4H PRN Clapacs, John T, MD      . atenolol (TENORMIN) tablet 12.5 mg  12.5 mg Oral Daily Jimmy FootmanHernandez-Gonzalez, Andrea, MD   12.5 mg at 10/30/16 [id number]  . FLUoxetine (PROZAC) capsule 10 mg  10 mg Oral Daily Jimmy FootmanHernandez-Gonzalez, Andrea, MD   10 mg at 10/30/16 0823  . magnesium hydroxide (MILK OF MAGNESIA) suspension 30 mL  30 mL Oral Daily PRN Clapacs, John T, MD      . nicotine (NICODERM CQ - dosed in mg/24 hours) patch 21 mg  21 mg Transdermal Daily Jimmy FootmanHernandez-Gonzalez, Andrea, MD   21 mg at 10/30/16 0826  . traZODone (DESYREL) tablet 150 mg  150 mg Oral QHS Jimmy FootmanHernandez-Gonzalez, Andrea, MD   150 mg at 10/29/16 2118   PTA Medications: Prescriptions Prior to Admission  Medication Sig Dispense Refill Last Dose  . ALPRAZolam (XANAX) 0.5 MG tablet TAKE 1/2 TO 1 TABLET BY MOUTH EVERY 4 HOURS AS NEEDED FOR ANXIETY 30 tablet 5 unknown at unknown  . LORazepam (ATIVAN) 1 MG tablet TAKE 1 TO 2 TABLETS BY MOUTH AT BEDTIME AS NEEDED ANXIETY 90 tablet 1 unknown at unknown  . traZODone (DESYREL) 50 MG tablet Take 1-2 tablets (50-100 mg total) by mouth at bedtime as needed for sleep. (Patient not taking: Reported on 10/28/2016) 180 tablet 3 Not Taking at  Unknown time  . valACYclovir (VALTREX) 1000 MG tablet Take by mouth.   unknown at unknown    Patient Stressors: Marital or family conflict Other: suicide attempt  Patient Strengths: Average or above average intelligence Capable of independent living  Treatment Modalities: Medication Management, Group therapy, Case management,  1 to 1 session with clinician, Psychoeducation, Recreational therapy.   Physician Treatment Plan for Primary Diagnosis: MDD (major depressive disorder), single episode, severe (HCC) Long Term Goal(s): Improvement in symptoms so as ready for discharge Improvement in symptoms so as ready for discharge   Short Term Goals: Ability to identify changes in lifestyle to reduce recurrence of condition will improve Ability to verbalize feelings will improve Ability to disclose and discuss suicidal ideas Ability to demonstrate self-control will improve Ability to maintain clinical measurements within normal limits will improve Ability to identify triggers associated with substance abuse/mental health issues will improve  Medication Management: Evaluate patient's response, side effects, and tolerance of medication regimen.  Therapeutic Interventions: 1 to 1 sessions, Unit Group sessions and Medication administration.  Evaluation of Outcomes: Progressing  Physician Treatment Plan for Secondary Diagnosis: Principal Problem:   MDD (major depressive disorder), single episode, severe (HCC) Active Problems:   Hip pain, chronic   Tobacco use disorder   Alcohol use disorder, severe, in sustained remission (HCC)   HTN (hypertension)  Long Term Goal(s): Improvement in symptoms so as ready for discharge Improvement in symptoms so  as ready for discharge   Short Term Goals: Ability to identify changes in lifestyle to reduce recurrence of condition will improve Ability to verbalize feelings will improve Ability to disclose and discuss suicidal ideas Ability to demonstrate  self-control will improve Ability to maintain clinical measurements within normal limits will improve Ability to identify triggers associated with substance abuse/mental health issues will improve     Medication Management: Evaluate patient's response, side effects, and tolerance of medication regimen.  Therapeutic Interventions: 1 to 1 sessions, Unit Group sessions and Medication administration.  Evaluation of Outcomes: Progressing   RN Treatment Plan for Primary Diagnosis: MDD (major depressive disorder), single episode, severe (HCC) Long Term Goal(s): Knowledge of disease and therapeutic regimen to maintain health will improve  Short Term Goals: Ability to verbalize feelings will improve, Ability to identify and develop effective coping behaviors will improve and Compliance with prescribed medications will improve  Medication Management: RN will administer medications as ordered by provider, will assess and evaluate patient's response and provide education to patient for prescribed medication. RN will report any adverse and/or side effects to prescribing provider.  Therapeutic Interventions: 1 on 1 counseling sessions, Psychoeducation, Medication administration, Evaluate responses to treatment, Monitor vital signs and CBGs as ordered, Perform/monitor CIWA, COWS, AIMS and Fall Risk screenings as ordered, Perform wound care treatments as ordered.  Evaluation of Outcomes: Progressing   LCSW Treatment Plan for Primary Diagnosis: MDD (major depressive disorder), single episode, severe (HCC) Long Term Goal(s): Safe transition to appropriate next level of care at discharge, Engage patient in therapeutic group addressing interpersonal concerns.  Short Term Goals: Engage patient in aftercare planning with referrals and resources, Increase social support and Increase ability to appropriately verbalize feelings  Therapeutic Interventions: Assess for all discharge needs, 1 to 1 time with Social  worker, Explore available resources and support systems, Assess for adequacy in community support network, Educate family and significant other(s) on suicide prevention, Complete Psychosocial Assessment, Interpersonal group therapy.  Evaluation of Outcomes: Progressing   Progress in Treatment: Attending groups: No. Participating in groups: No. Taking medication as prescribed: Yes. Toleration medication: Yes. Family/Significant other contact made: Yes, individual(s) contacted:  CSW spoke with patient's mother. Patient understands diagnosis: Yes. Discussing patient identified problems/goals with staff: Yes. Medical problems stabilized or resolved: Yes. Denies suicidal/homicidal ideation: Yes. Issues/concerns per patient self-inventory: No.  New problem(s) identified: No, Describe:  None.  New Short Term/Long Term Goal(s): Patient stated that his goal is to learn better coping mechanisms.  Discharge Plan or Barriers: Pt will return home and follow-up with outpatient providers.  Reason for Continuation of Hospitalization: Depression Medication stabilization Suicidal ideation   Estimated Length of Stay: D/C Friday, 7/6   Attendees: Patient: Patrick Lucas 10/30/2016 11:24 AM  Physician: Dr. Radene Journey, MD  10/30/2016 11:24 AM  Nursing: Hulan Amato, RN  10/30/2016 11:24 AM  RN Care Manager: 10/30/2016 11:24 AM  Social Worker: Hampton Abbot, MSW, LCSW-A 10/30/2016 11:24 AM  Recreational Therapist: Princella Ion, LRT, CTRS  10/30/2016 11:24 AM  Other:  10/30/2016 11:24 AM  Other:  10/30/2016 11:24 AM  Other: 10/30/2016 11:24 AM    Scribe for Treatment Team: Lynden Oxford, LCSWA 10/30/2016 11:24 AM

## 2016-10-30 NOTE — Progress Notes (Signed)
[facility]  10/30/2016 10:40 AM Patrick Lucas  MRN:  [id number] Subjective:    [name] is  A 46 yo married,  caucasian male, with no prior psychiatric history,  who was admitted to the behavioral inpatient unit after a suicide attempt by overdose on xanax, ativan and alcohol.    He reports that his depression began over 6 months ago and that the suicidal thoughts had been in the back of his head for a while, until last night when he "just gave up," drank 2-3 beers and took 7 xanax and 9 ativans.  He believes his wife is the one who called 911.  He states that him and his wife have "not been getting along" and that his wife "doesn't trust him."  He reports that this began right around the time that he started getting depressed.  In addition, he has been experiencing stress at work.  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  His PCP had prescribed ativan, xanax, and trazadone prn to help him sleep. The pt reports that he takes the ativan about 3X/week, and the xanax about 2 x/ wk.  He reports that his concentration has been 'okay" and his appetite has been fine. He denies any homicidal ideation, anxiety, delusions, hallucinations, or symptoms of mania.  The patient reports one prior episode of depression that happened years ago, and he was able to work through it himself by "taking it one day at a time." He has never been diagnosed with depression or any other mental illness.  He has a prior history of alcohol abuse but he has been sober since 2005.[name]. He denies any other substance abuse.  He admits to smoking a ppd.    He reports a history of head trauma at age 89, denies hx of seizures or any past suicide attempts.  He denies any hx of child abuse or domestic violence  Today, he reports no suicidal thoughts and his happy that the suicidal attempt did not work.  He is  positive and is willing to seek marriage counseling with his wife.    7/4: pt complains of back pain, but he blames this on the bed.  He reports that he didn't sleep well last night, but he also took naps during the day that may have contributed to his inability to sleep and will try the same dose of trazodone tonight.  Spoke with the wife, and she reports that his suicide attempt was a "cry for help" and that he was using it to manipulate her back into the relationship.  She had left him because she found out that he had been having multiple affairs with other women.  The patient did not mention this to us.  He said that he has spoken with is wife and will go back living with her upon discharge. Denies any suicidal or homicidal ideations, hallucinations, or delusions.    Principal Problem: MDD (major depressive disorder), single episode, severe (HCC) Diagnosis:   Patient Active Problem List   Diagnosis Date Noted  . Tobacco use disorder [F17.200] 10/29/2016  . Alcohol use disorder, severe, in sustained remission (HCC) [F10.21] 10/29/2016  . MDD (major depressive disorder), single episode, severe (HCC) [F32.2] 10/29/2016  . HTN (hypertension) [I10] 10/29/2016  . Hip pain, chronic [M25.559, G89.29] 10/14/2014   Total Time spent with patient: 30 minutes  Past Psychiatric History: Pt reports one prior episode of depression that happened years ago, has never been diagnosed with a mental illness and has never been on any psychotropics other than xanax and ativan.   Past Medical History:  Past Medical History:  Diagnosis Date  . Allergic rhinitis   . Anxiety   . Fracture of multiple ribs, right, closed. initial encounter 10/14/2014  . Fracture of one rib of right side, sequela 10/14/2014  . Insomnia   . Sciatica 10/14/2014   History reviewed. No pertinent surgical history. Family History: History reviewed. No pertinent family history. Family Psychiatric  History: no family history of mental  illnesses or substance abuse  Social History:  History  Alcohol Use  . 1.2 oz/week  . 2 Cans of beer per week     History  Drug Use  . Types: Benzodiazepines    Social History   Social History  . Marital status: Married    Spouse name: N/A  . Number of children: N/A  . Years of education: GED   Occupational History  . Employed    Social History Main Topics  . Smoking status: Current Every Day Smoker    Packs/day: 1.00    Years: 30.00    Types: Cigarettes    Start date: 10/13/1984  . Smokeless tobacco: Never Used  . Alcohol use 1.2 oz/week    2 Cans of beer per week  . Drug use: Yes    Types: Benzodiazepines  . Sexual activity: Yes   Other Topics Concern  . None   Social History Narrative  . 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  He has an older brother and sister that he has some contact with, and is in touch with his mother.  He has one son from a previous relationship that he speaks to about once per month. He found out that he had a son just a couple of years ago when his son was actually 20 years.                  Sleep: Fair  Appetite:  Good  Current Medications: Current Facility-Administered Medications  Medication Dose Route Frequency Provider Last Rate Last Dose  . acetaminophen (TYLENOL) tablet 650 mg  650 mg Oral Q6H PRN Clapacs, John T, MD      . alum & mag hydroxide-simeth (MAALOX/MYLANTA) 200-200-20 MG/5ML suspension 30 mL  30 mL Oral Q4H PRN Clapacs, John T, MD      . atenolol (TENORMIN) tablet 12.5 mg  12.5 mg Oral Daily Jimmy Footman, MD   12.5 mg at 10/30/16 6962  . FLUoxetine (PROZAC) capsule 10 mg  10 mg Oral Daily Jimmy Footman, MD   10 mg at 10/30/16 0823  . magnesium hydroxide (MILK OF MAGNESIA) suspension 30 mL  30 mL Oral Daily PRN Clapacs, John T, MD      . nicotine (NICODERM CQ - dosed in mg/24 hours) patch 21 mg  21  mg Transdermal Daily Jimmy Footman, MD   21 mg at 10/30/16 0826  . traZODone (DESYREL) tablet 150 mg  150 mg Oral QHS Jimmy Footman, MD   150 mg at 10/29/16 2118    Lab Results:  Results for orders placed or performed during the hospital encounter of 10/28/16 (from the past 48 hour(s))  Hemoglobin A1c     Status: None  Collection Time: 10/29/16  6:28 AM  Result Value Ref Range   Hgb A1c MFr Bld 5.6 4.8 - 5.6 %    Comment: (NOTE)         Pre-diabetes: 5.7 - 6.4         Diabetes: >6.4         Glycemic control for adults with diabetes: <7.0    Mean Plasma Glucose 114 mg/dL    Comment: (NOTE) Performed At: [name] [address] [place], [place] [id number] Mila Homer MD WU:[id number]   Lipid panel     Status: Abnormal   Collection Time: 10/29/16  6:28 AM  Result Value Ref Range   Cholesterol 225 (H) 0 - 200 mg/dL   Triglycerides 956 (H) <150 mg/dL   HDL 42 >21 mg/dL   Total CHOL/HDL Ratio 5.4 RATIO   VLDL 64 (H) 0 - 40 mg/dL   LDL Cholesterol 308 (H) 0 - 99 mg/dL    Comment:        Total Cholesterol/HDL:CHD Risk Coronary Heart Disease Risk Table                     Men   Women  1/2 Average Risk   3.4   3.3  Average Risk       5.0   4.4  2 X Average Risk   9.6   7.1  3 X Average Risk  23.4   11.0        Use the calculated Patient Ratio above and the CHD Risk Table to determine the patient's CHD Risk.        ATP III CLASSIFICATION (LDL):  <100     mg/dL   Optimal  657-846  mg/dL   Near or Above                    Optimal  130-159  mg/dL   Borderline  962-952  mg/dL   High  >841     mg/dL   Very High   TSH     Status: None   Collection Time: 10/29/16  6:28 AM  Result Value Ref Range   TSH 0.514 0.350 - 4.500 uIU/mL    Comment: Performed by a 3rd Generation assay with a functional sensitivity of <=0.01 uIU/mL.    Blood Alcohol level:  Lab Results  Component Value Date   ETH <5 10/28/2016    Metabolic Disorder  Labs: Lab Results  Component Value Date   HGBA1C 5.6 10/29/2016   MPG 114 10/29/2016   No results found for: PROLACTIN Lab Results  Component Value Date   CHOL 225 (H) 10/29/2016   TRIG 318 (H) 10/29/2016   HDL 42 10/29/2016   CHOLHDL 5.4 10/29/2016   VLDL 64 (H) 10/29/2016   LDLCALC 119 (H) 10/29/2016    Physical Findings: AIMS:  , ,  ,  ,    CIWA:  CIWA-Ar Total: 0 COWS:     Musculoskeletal: Strength & Muscle Tone: within normal limits Gait & Station: normal Patient leans: N/A  Psychiatric Specialty Exam: Physical Exam  Vitals reviewed. Constitutional: He is oriented to person, place, and time. He appears well-developed and well-nourished.  Respiratory: Effort normal and breath sounds normal.  Neurological: He is alert and oriented to person, place, and time.  Skin: Skin is warm and dry.  Psychiatric: His speech is normal. His mood appears not anxious. He is not agitated and not aggressive. Thought content is not paranoid and  not delusional. Cognition and memory are normal. He does not exhibit a depressed mood. He expresses no homicidal and no suicidal ideation.    Review of Systems  Constitutional: Negative for chills and fever.  Musculoskeletal: Positive for back pain.  Neurological: Negative for dizziness, loss of consciousness and headaches.  Psychiatric/Behavioral: Negative for depression and suicidal ideas. The patient is not nervous/anxious.     Blood pressure 112/81, pulse 85, temperature 98 F (36.7 C), resp. rate 18, height 6\' 1"  (1.854 m), weight 92.1 kg (203 lb), SpO2 100 %.Body mass index is 26.78 kg/m.  General Appearance: Fairly Groomed  Eye Contact:  Good  Speech:  Normal Rate  Volume:  Normal  Mood:  Euthymic  Affect:  Appropriate  Thought Process:  Coherent and Descriptions of Associations: Intact  Orientation:  Full (Time, Place, and Person)  Thought Content:  Hallucinations: None  Suicidal Thoughts:  No  Homicidal Thoughts:  No  Memory:   Immediate;   Good Recent;   Good Remote;   Good  Judgement:  Fair  Insight:  Shallow  Psychomotor Activity:  Normal  Concentration:  Concentration: Good and Attention Span: Good  Recall:  Good  Fund of Knowledge:  Good  Language:  Good  Akathisia:  No  Handed:  Left  AIMS (if indicated):     Assets:  Communication Skills Desire for Improvement Financial Resources/Insurance Housing Physical Health Social Support Talents/Skills Transportation  ADL's:  Intact  Cognition:  WNL  Sleep:  Number of Hours: 6.45     Treatment Plan Summary: Pt is a 46 yo caucasian male who presents with depression and suicide attempt by xanax and ativan overdose.  Major Depressive Disorder with suicide attempt:  improving, continue  prozac 10 mg po qd.  Social worker is looking into setting up outpatient care with therapy and psychiatrist for pt upon discharge.   Insomnia:continue trazodone 150 mg po qPM for sleep.  Benzodiazepine overdose: no withdrawals present. Continue to monitor patient. No history of abuse  Alcohol use disorder: Patient is states that he has been sober from alcoholism since 2005. He drank a couple beers the night of the OD  Elevated blood pressure and heart rate: start atenolol 12.5 mg po qd, continue to monitor  Tobacco Cessation: will start Nicotine patch of 21 mg  Diet: low sodium  Precautionary checks every 15 mins  VS daily  Labs: TSH 0.514 Total Cholesterol 225 HDL 42 LDL 119 Hgb A1C 5.6  Disposition: will continue to monitor patient daily.  Social worker is looking for outpt therapy upon discharge.  relationship btw him and his wife have improved, will begin marriage counseling.    Patient has denied having any access to guns Jeanann Lewandowsky, Student-PA 10/30/2016, 10:40 AM

## 2016-10-30 NOTE — [facility] Group Notes (Signed)
[facility] Group Notes:  (Nursing/MHT/Case Management/Adjunct)  Date:  10/30/2016  Time:  7:05 AM  Type of Therapy:  Psychoeducational Skills  Participation Level:  Active  Participation Quality:  Appropriate, Attentive and Sharing  Affect:  Appropriate  Cognitive:  Appropriate  Insight:  Appropriate  Engagement in Group:  Engaged  Modes of Intervention:  Discussion, Socialization and Support  Summary of Progress/Problems:  Chancy MilroyLaquanda Y Isidro Monks 10/30/2016, 7:05 AM

## 2016-10-30 NOTE — Plan of Care (Signed)
Problem: Education: Goal: Knowledge of [facility] Education information/materials will improve Outcome: Progressing Able to verbalize information received

## 2016-10-30 NOTE — [facility] Group Notes (Signed)
[facility] Group Notes:  (Nursing/MHT/Case Management/Adjunct)  Date:  10/30/2016  Time:  11:17 PM  Type of Therapy:  Psychoeducational Skills  Participation Level:  Active  Participation Quality:  Appropriate, Attentive and Sharing  Affect:  Appropriate  Cognitive:  Oriented  Insight:  Good  Engagement in Group:  Improving  Modes of Intervention:  Discussion and Exploration  Summary of Progress/Problems:  Patrick Lucas 10/30/2016, 11:17 PM

## 2016-10-30 NOTE — Progress Notes (Signed)
[facility] MD Progress Note  10/30/2016 7:56 AM Patrick Lucas  MRN:  [id number] Subjective:  [name] is  A 46 yo married,  caucasian male, with no prior psychiatric history,  who was admitted to the behavioral inpatient unit after a suicide attempt by overdose on xanax, ativan and alcohol.    He reports that his depression began over 6 months ago and that the suicidal thoughts had been in the back of his head for a while, until last night when he "just gave up," drank 2-3 beers and took 7 xanax and 9 ativans.  He believes his wife is the one who called 911.  He states that him and his wife have "not been getting along" and that his wife "doesn't trust him."  He reports that this began right around the time that he started getting depressed.  In addition, he has been experiencing stress at work.  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  His PCP had prescribed ativan, xanax, and trazadone prn to help him sleep. The pt reports that he takes the ativan about 3X/week, and the xanax about 2 x/ wk.  He reports that his concentration has been 'okay" and his appetite has been fine. He denies any homicidal ideation, anxiety, delusions, hallucinations, or symptoms of mania.  The patient reports one prior episode of depression that happened years ago, and he was able to work through it himself by "taking it one day at a time." He has never been diagnosed with depression or any other mental illness.  He has a prior history of alcohol abuse but he has been sober since 2005.[name] He denies any other substance abuse.  He admits to smoking a ppd.    7/4 patient was seen this morning. He had a smiley in his face and was seen participating in attending groups. He denies major problems overnight. He said he slept okay. He feels better and denies having any suicidal thoughts. He denies any side effects from  medications or any physical complaints other than some mild back pain. Patient says his wife came to visit him last night. He feels things are better between them and they plan to start counseling together  Per nursing: D D: Patient stated slept fair last night .Stated appetite is good and energy level  Is normal. Stated concentration is good . Stated on Depression scale 4, hopeless 3 and anxiety 3 .( low 0-10 high) Denies suicidal  homicidal ideations  .  No auditory hallucinations  No pain concerns . Appropriate ADL'S. Interacting with peers and staff.  Patient focused is on going home  A: Encourage patient participation with unit programming . Instruction  Given on  Medication , verbalize understanding. R: Voice no other concerns. Staff continue to monitor   Principal Problem: MDD (major depressive disorder), single episode, severe (HCC) Diagnosis:   Patient Active Problem List   Diagnosis Date Noted  . Tobacco use disorder [F17.200] 10/29/2016  . Alcohol use disorder, severe, in sustained remission (HCC) [F10.21] 10/29/2016  . MDD (major depressive disorder), single episode, severe (HCC) [F32.2] 10/29/2016  . HTN (hypertension) [I10] 10/29/2016  . Hip pain, chronic [M25.559, G89.29] 10/14/2014   Total Time spent with patient: 30 minutes  Past Psychiatric History: Pt reports one prior episode of depression that happened years ago, has never been diagnosed with a mental illness and has never  been on any psychotropics other than xanax and ativan.  Past Medical History:  Past Medical History:  Diagnosis Date  . Allergic rhinitis   . Anxiety   . Fracture of multiple ribs, right, closed. initial encounter 10/14/2014  . Fracture of one rib of right side, sequela 10/14/2014  . Insomnia   . Sciatica 10/14/2014   History reviewed. No pertinent surgical history.  Family History: History reviewed. No pertinent family history.  Family Psychiatric  History:  no family history of mental illnesses  or substance abuse.  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  He has an older brother and sister that he has some contact with, and is in touch with his mother.  He has one son from a previous relationship that he speaks to about once per month. He found out that he had a son just a couple of years ago when his son was actually 20 years.  Patient has been married twice. He has been with his current wife for 3 years.. History  Alcohol Use  . 1.2 oz/week  . 2 Cans of beer per week     History  Drug Use  . Types: Benzodiazepines    Social History   Social History  . Marital status: Married    Spouse name: N/A  . Number of children: N/A  . Years of education: GED   Occupational History  . Employed    Social History Main Topics  . Smoking status: Current Every Day Smoker    Packs/day: 1.00    Years: 30.00    Types: Cigarettes    Start date: 10/13/1984  . Smokeless tobacco: Never Used  . Alcohol use 1.2 oz/week    2 Cans of beer per week  . Drug use: Yes    Types: Benzodiazepines  . Sexual activity: Yes   Other Topics Concern  . None   Social History Narrative  . None    Current Medications: Current Facility-Administered Medications  Medication Dose Route Frequency Provider Last Rate Last Dose  . acetaminophen (TYLENOL) tablet 650 mg  650 mg Oral Q6H PRN Clapacs, John T, MD      . alum & mag hydroxide-simeth (MAALOX/MYLANTA) 200-200-20 MG/5ML suspension 30 mL  30 mL Oral Q4H PRN Clapacs, John T, MD      . atenolol (TENORMIN) tablet 12.5 mg  12.5 mg Oral Daily Jimmy FootmanHernandez-Gonzalez, Raeleen Winstanley, MD   12.5 mg at 10/29/16 1234  . FLUoxetine (PROZAC) capsule 10 mg  10 mg Oral Daily Jimmy FootmanHernandez-Gonzalez, Kyi Romanello, MD   10 mg at 10/29/16 1223  . magnesium hydroxide (MILK OF MAGNESIA) suspension 30 mL  30 mL Oral Daily PRN Clapacs, John T, MD      . nicotine (NICODERM CQ - dosed in mg/24 hours) patch 21 mg  21 mg Transdermal Daily  Jimmy FootmanHernandez-Gonzalez, Arlisha Patalano, MD   21 mg at 10/29/16 1223  . traZODone (DESYREL) tablet 150 mg  150 mg Oral QHS Jimmy FootmanHernandez-Gonzalez, Agusta Hackenberg, MD   150 mg at 10/29/16 2118    Lab Results:  Results for orders placed or performed during the hospital encounter of 10/28/16 (from the past 48 hour(s))  Hemoglobin A1c     Status: None   Collection Time: 10/29/16  6:28 AM  Result Value Ref Range   Hgb A1c MFr Bld 5.6 4.8 - 5.6 %    Comment: (NOTE)         Pre-diabetes: 5.7 - 6.4  Diabetes: >6.4         Glycemic control for adults with diabetes: <7.0    Mean Plasma Glucose 114 mg/dL    Comment: (NOTE) Performed At: [facility] [address] [place], [place] [id number] Mila Homer MD WU:[id number]   Lipid panel     Status: Abnormal   Collection Time: 10/29/16  6:28 AM  Result Value Ref Range   Cholesterol 225 (H) 0 - 200 mg/dL   Triglycerides 956 (H) <150 mg/dL   HDL 42 >21 mg/dL   Total CHOL/HDL Ratio 5.4 RATIO   VLDL 64 (H) 0 - 40 mg/dL   LDL Cholesterol 308 (H) 0 - 99 mg/dL    Comment:        Total Cholesterol/HDL:CHD Risk Coronary Heart Disease Risk Table                     Men   Women  1/2 Average Risk   3.4   3.3  Average Risk       5.0   4.4  2 X Average Risk   9.6   7.1  3 X Average Risk  23.4   11.0        Use the calculated Patient Ratio above and the CHD Risk Table to determine the patient's CHD Risk.        ATP III CLASSIFICATION (LDL):  <100     mg/dL   Optimal  657-846  mg/dL   Near or Above                    Optimal  130-159  mg/dL   Borderline  962-952  mg/dL   High  >841     mg/dL   Very High   TSH     Status: None   Collection Time: 10/29/16  6:28 AM  Result Value Ref Range   TSH 0.514 0.350 - 4.500 uIU/mL    Comment: Performed by a 3rd Generation assay with a functional sensitivity of <=0.01 uIU/mL.    Blood Alcohol level:  Lab Results  Component Value Date   ETH <5 10/28/2016    Metabolic Disorder Labs: Lab Results   Component Value Date   HGBA1C 5.6 10/29/2016   MPG 114 10/29/2016   No results found for: PROLACTIN Lab Results  Component Value Date   CHOL 225 (H) 10/29/2016   TRIG 318 (H) 10/29/2016   HDL 42 10/29/2016   CHOLHDL 5.4 10/29/2016   VLDL 64 (H) 10/29/2016   LDLCALC 119 (H) 10/29/2016    Physical Findings: AIMS:  , ,  ,  ,    CIWA:  CIWA-Ar Total: 0 COWS:     Musculoskeletal: Strength & Muscle Tone: within normal limits Gait & Station: normal Patient leans: N/A  Psychiatric Specialty Exam: Physical Exam  Constitutional: He is oriented to person, place, and time. He appears well-developed and well-nourished.  HENT:  Head: Normocephalic and atraumatic.  Eyes: Conjunctivae and EOM are normal.  Neck: Normal range of motion.  Respiratory: Effort normal.  Musculoskeletal: Normal range of motion.  Neurological: He is alert and oriented to person, place, and time.    Review of Systems  Constitutional: Negative.   HENT: Negative.   Eyes: Negative.   Respiratory: Negative.   Cardiovascular: Negative.   Gastrointestinal: Negative.   Genitourinary: Negative.   Musculoskeletal: Negative.   Skin: Negative.   Neurological: Negative.   Endo/Heme/Allergies: Negative.   Psychiatric/Behavioral: Positive for depression. Negative for hallucinations,  memory loss, substance abuse and suicidal ideas. The patient is not nervous/anxious and does not have insomnia.     Blood pressure 112/81, pulse 85, temperature 98 F (36.7 C), resp. rate 18, height 6\' 1"  (1.854 m), weight 92.1 kg (203 lb), SpO2 100 %.Body mass index is 26.78 kg/m.  General Appearance: Well Groomed  Eye Contact:  Good  Speech:  Clear and Coherent  Volume:  Normal  Mood:  Euthymic  Affect:  Appropriate and Congruent  Thought Process:  Linear and Descriptions of Associations: Intact  Orientation:  Full (Time, Place, and Person)  Thought Content:  Hallucinations: None  Suicidal Thoughts:  No  Homicidal Thoughts:   No  Memory:  Immediate;   Good Recent;   Good Remote;   Good  Judgement:  Fair  Insight:  Fair  Psychomotor Activity:  Decreased  Concentration:  Concentration: Good and Attention Span: Good  Recall:  Good  Fund of Knowledge:  Good  Language:  Good  Akathisia:  No  Handed:    AIMS (if indicated):     Assets:  Manufacturing systems engineer Social Support  ADL's:  Intact  Cognition:  WNL  Sleep:  Number of Hours: 6.45     Treatment Plan Summary:  Pt is a 46 yo caucasian male who presents with depression and suicide attempt by xanax and ativan overdose.  Major Depressive Disorder with suicide attempt: pt has been started on fluoxetine 10 mg po q day. Social worker is looking into setting up outpatient care with therapy and psychiatrist for pt upon discharge.   Insomnia: continue  trazodone 150 mg po qPM for sleep.  Benzodiazepine overdose: no withdrawals present. Continue to monitor patient. No history of abuse  Alcohol use disorder: Patient is states that he has been sober from alcoholism since 2005. He drank a couple beers the night of the OD.  No evidence of withdrawal  Elevated blood pressure and heart rate: continue atenolol 12.5 mg po qd, continue to monitor  Tobacco Cessation: continue Nicotine patch of 21 mg  Diet: low sodium  Precautionary checks every 15 mins  VS daily  Labs: TSH 0.514 Total Cholesterol 225 HDL 42 LDL 119 Hgb A1C pending   Disposition: will continue to monitor patient daily.  Social worker is looking for outpt therapy upon discharge.  Anticipate a 3-5 day inpatient course.  Collateral information: Child psychotherapist spoke with the patient's wife. She said that he has been seeing other women. She states that this was a manipulative way of preventing her from leaving him.  She has removed all guns from the house.  Possible discharge in the next 2-3 days  Jimmy Footman, MD 10/30/2016, 7:56 AM

## 2016-10-30 NOTE — Progress Notes (Signed)
Patient ID: Patrick Lucas, male   DOB: 09/11/1970, 46 y.o.   MRN: [id number] Disheveled, still wearing the same shirt and short nicker, mood and affect more congruent, visible in the milieu, interacting well with peers, pleasant on approach, no crying spells, CIWA=0, denied pain, denied SI/SIB/HI/AVH, "my wife brought some clothes to me today.[name].[name]."

## 2016-10-31 MED ORDER — ATENOLOL 25 MG PO TABS: 12.5000 mg | ORAL_TABLET | Freq: Every day | ORAL | 0 refills | Status: DC

## 2016-10-31 MED ORDER — TRAZODONE HCL 150 MG PO TABS: 150.0000 mg | ORAL_TABLET | Freq: Every evening | ORAL | 0 refills | Status: DC | PRN

## 2016-10-31 MED ORDER — FLUOXETINE HCL 20 MG PO CAPS: 20.0000 mg | ORAL_CAPSULE | Freq: Every day | ORAL | 0 refills | Status: DC

## 2016-10-31 MED ORDER — LIDOCAINE 5 % EX PTCH
1.0000 | MEDICATED_PATCH | CUTANEOUS | Status: DC
  Administered 2016-10-31: 1 via TRANSDERMAL
  Filled 2016-10-31 (×2): qty 1

## 2016-10-31 MED ORDER — IBUPROFEN 600 MG PO TABS
600.0000 mg | ORAL_TABLET | Freq: Two times a day (BID) | ORAL | Status: DC
  Administered 2016-10-31 – 2016-11-01 (×3): 600 mg via ORAL
  Filled 2016-10-31 (×2): qty 1

## 2016-10-31 NOTE — Progress Notes (Signed)
Patient ID: Patrick Lucas, male   DOB: 08/15/1970, 46 y.o.   MRN: [id number] Improved in mood and affect, organized, no thoughts impairment, hopeful, optimistic, well groomed, "I may be going home on Friday, doctor told me..." Denied SI/SIB/HI/AVH.

## 2016-10-31 NOTE — Progress Notes (Signed)
Patient excited about discharge on tomorrow. Denies SI, HI, AVH. Med and group compliant. Appropriate with staff and peers. Pt does endorse some depression, but reports much improvement.  Encouragement and support offered, safety checks maintained. Medications given as prescribed. Pt receptive and remains safe on unit with q 15 min checks.

## 2016-10-31 NOTE — Plan of Care (Signed)
Problem: Activity: Goal: Sleeping patterns will improve Outcome: Progressing Patient slept for Estimated Hours of 7.15; q15 minutes safety round maintained, no injury or falls during this shift.    

## 2016-10-31 NOTE — Progress Notes (Signed)
[facility]  10/31/2016 11:36 AM Patrick Lucas  MRN:  [id number] Subjective:  [name] is  A 46 yo married,  caucasian male, with no prior psychiatric history,  who was admitted to the behavioral inpatient unit after a suicide attempt by overdose on xanax, ativan and alcohol.    He reports that his depression began over 6 months ago and that the suicidal thoughts had been in the back of his head for a while, until last night when he "just gave up," drank 2-3 beers and took 7 xanax and 9 ativans.  He believes his wife is the one who called 911.  He states that him and his wife have "not been getting along" and that his wife "doesn't trust him."  He reports that this began right around the time that he started getting depressed.  In addition, he has been experiencing stress at work.  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  His PCP had prescribed ativan, xanax, and trazadone prn to help him sleep. The pt reports that he takes the ativan about 3X/week, and the xanax about 2 x/ wk.  He reports that his concentration has been 'okay" and his appetite has been fine. He denies any homicidal ideation, anxiety, delusions, hallucinations, or symptoms of mania.  The patient reports one prior episode of depression that happened years ago, and he was able to work through it himself by "taking it one day at a time." He has never been diagnosed with depression or any other mental illness.  He has a prior history of alcohol abuse but he has been sober since 2005.[name]. He denies any other substance abuse.  He admits to smoking a ppd.    7/4 patient was seen this morning. He had a smiley in his face and was seen participating in attending groups. He denies major problems overnight. He said he slept okay. He feels better and denies having any suicidal thoughts. He denies any side effects from  medications or any physical complaints other than some mild back pain. Patient says his wife came to visit him last night. He feels things are better between them and they plan to start counseling together  7/5 patient reports doing very well. His mood appears bright and reactive. He denies hopelessness, suicidality or homicidality. He denies any side effects from medications.  He denies problems with his mood, appetite, energy or concentration. He has been participating in groups. He has been talking with family and wife.  As far as physical complaints he said he is having some back pain. Says that for the last 2 nights he hasn't slept well because of it.  Per nursing: Improved in mood and affect, organized, no thoughts impairment, hopeful, optimistic, well groomed, "I may be going home on Friday, doctor told me..." Denied SI/SIB/HI/AVH.  Principal Problem: MDD (major depressive disorder), single episode, severe (HCC) Diagnosis:   Patient Active Problem List   Diagnosis Date Noted  . Tobacco use disorder [F17.200] 10/29/2016  . Alcohol use disorder, severe, in sustained remission (HCC) [F10.21] 10/29/2016  . MDD (major depressive disorder), single episode, severe (HCC) [F32.2] 10/29/2016  . HTN (hypertension) [I10] 10/29/2016  . Hip pain, chronic [M25.559, G89.29] 10/14/2014   Total Time spent with patient: 30 minutes  Past Psychiatric History: Pt reports one prior episode of depression that happened years ago, has never  been diagnosed with a mental illness and has never been on any psychotropics other than xanax and ativan.  Past Medical History:  Past Medical History:  Diagnosis Date  . Allergic rhinitis   . Anxiety   . Fracture of multiple ribs, right, closed. initial encounter 10/14/2014  . Fracture of one rib of right side, sequela 10/14/2014  . Insomnia   . Sciatica 10/14/2014   History reviewed. No pertinent surgical history.  Family History: History reviewed. No pertinent  family history.  Family Psychiatric  History:  no family history of mental illnesses or substance abuse.  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  He has an older brother and sister that he has some contact with, and is in touch with his mother.  He has one son from a previous relationship that he speaks to about once per month. He found out that he had a son just a couple of years ago when his son was actually 20 years.  Patient has been married twice. He has been with his current wife for 3 years.. History  Alcohol Use  . 1.2 oz/week  . 2 Cans of beer per week     History  Drug Use  . Types: Benzodiazepines    Social History   Social History  . Marital status: Married    Spouse name: N/A  . Number of children: N/A  . Years of education: GED   Occupational History  . Employed    Social History Main Topics  . Smoking status: Current Every Day Smoker    Packs/day: 1.00    Years: 30.00    Types: Cigarettes    Start date: 10/13/1984  . Smokeless tobacco: Never Used  . Alcohol use 1.2 oz/week    2 Cans of beer per week  . Drug use: Yes    Types: Benzodiazepines  . Sexual activity: Yes   Other Topics Concern  . None   Social History Narrative  . None    Current Medications: Current Facility-Administered Medications  Medication Dose Route Frequency Provider Last Rate Last Dose  . acetaminophen (TYLENOL) tablet 650 mg  650 mg Oral Q6H PRN Clapacs, Jackquline Denmark, MD   650 mg at 10/31/16 0403  . alum & mag hydroxide-simeth (MAALOX/MYLANTA) 200-200-20 MG/5ML suspension 30 mL  30 mL Oral Q4H PRN Clapacs, John T, MD      . atenolol (TENORMIN) tablet 12.5 mg  12.5 mg Oral Daily Jimmy Footman, MD   12.5 mg at 10/31/16 1610  . FLUoxetine (PROZAC) capsule 10 mg  10 mg Oral Daily Jimmy Footman, MD   10 mg at 10/31/16 9604  . magnesium hydroxide (MILK OF MAGNESIA) suspension 30 mL  30 mL Oral Daily PRN  Clapacs, John T, MD      . nicotine (NICODERM CQ - dosed in mg/24 hours) patch 21 mg  21 mg Transdermal Daily Jimmy Footman, MD   21 mg at 10/30/16 0826  . traZODone (DESYREL) tablet 150 mg  150 mg Oral QHS Jimmy Footman, MD   150 mg at 10/30/16 2114    Lab Results:  No results found for this or any previous visit (from the past 48 hour(s)).  Blood Alcohol level:  Lab Results  Component Value Date   ETH <5 10/28/2016    Metabolic Disorder Labs: Lab Results  Component Value Date   HGBA1C 5.6 10/29/2016   MPG 114 10/29/2016   No  results found for: PROLACTIN Lab Results  Component Value Date   CHOL 225 (H) 10/29/2016   TRIG 318 (H) 10/29/2016   HDL 42 10/29/2016   CHOLHDL 5.4 10/29/2016   VLDL 64 (H) 10/29/2016   LDLCALC 119 (H) 10/29/2016    Physical Findings: AIMS:  , ,  ,  ,    CIWA:  CIWA-Ar Total: 0 COWS:     Musculoskeletal: Strength & Muscle Tone: within normal limits Gait & Station: normal Patient leans: N/A  Psychiatric Specialty Exam: Physical Exam  Constitutional: He is oriented to person, place, and time. He appears well-developed and well-nourished.  HENT:  Head: Normocephalic and atraumatic.  Eyes: Conjunctivae and EOM are normal.  Neck: Normal range of motion.  Respiratory: Effort normal.  Musculoskeletal: Normal range of motion.  Neurological: He is alert and oriented to person, place, and time.    Review of Systems  Constitutional: Negative.   HENT: Negative.   Eyes: Negative.   Respiratory: Negative.   Cardiovascular: Negative.   Gastrointestinal: Negative.   Genitourinary: Negative.   Musculoskeletal: Negative.   Skin: Negative.   Neurological: Negative.   Endo/Heme/Allergies: Negative.   Psychiatric/Behavioral: Negative for depression, hallucinations, memory loss, substance abuse and suicidal ideas. The patient is not nervous/anxious and does not have insomnia.     Blood pressure 119/77, pulse 85, temperature  97.9 F (36.6 C), temperature source Oral, resp. rate 20, height 6\' 1"  (1.854 m), weight 92.1 kg (203 lb), SpO2 97 %.Body mass index is 26.78 kg/m.  General Appearance: Well Groomed  Eye Contact:  Good  Speech:  Clear and Coherent  Volume:  Normal  Mood:  Euthymic  Affect:  Appropriate and Congruent  Thought Process:  Linear and Descriptions of Associations: Intact  Orientation:  Full (Time, Place, and Person)  Thought Content:  Hallucinations: None  Suicidal Thoughts:  No  Homicidal Thoughts:  No  Memory:  Immediate;   Good Recent;   Good Remote;   Good  Judgement:  Fair  Insight:  Fair  Psychomotor Activity:  Decreased  Concentration:  Concentration: Good and Attention Span: Good  Recall:  Good  Fund of Knowledge:  Good  Language:  Good  Akathisia:  No  Handed:    AIMS (if indicated):     Assets:  Manufacturing systems engineer Social Support  ADL's:  Intact  Cognition:  WNL  Sleep:  Number of Hours: 7.15     Treatment Plan Summary:  Pt is a 46 yo caucasian male who presents with depression and suicide attempt by xanax and ativan overdose.  Major Depressive Disorder with suicide attempt: pt has been started on fluoxetine 10 mg po q day. Social worker is looking into setting up outpatient care with therapy and psychiatrist for pt upon discharge.   Insomnia: continue  trazodone 150 mg po qPM for sleep.  Benzodiazepine overdose: no withdrawals present. Continue to monitor patient. No history of abuse  Alcohol use disorder: Patient is states that he has been sober from alcoholism since 2005. He drank a couple beers the night of the OD.  No evidence of withdrawal  Elevated blood pressure and heart rate: continue atenolol 12.5 mg po qd, continue to monitor. BP and HR wnl  Back pain and I will order ibuprofen and a Lidoderm patch.  Tobacco Cessation: continue Nicotine patch of 21 mg  Diet: low sodium  Precautionary checks every 15 mins  VS daily  Labs: TSH  0.514 Total Cholesterol 225 HDL 42 LDL 119 Hgb A1C pending  Disposition: will continue to monitor patient daily.  Social worker is looking for outpt therapy upon discharge.  Anticipate a 3-5 day inpatient course.  Collateral information: Child psychotherapist spoke with the patient's wife. She said that he has been seeing other women. She states that this was a manipulative way of preventing her from leaving him.  She has removed all guns from the house.  Possible discharge in the next 2-3 days  Jimmy Footman, MD 10/31/2016, 11:36 AM

## 2016-10-31 NOTE — Discharge Summary (Signed)
Physician Discharge Summary Note  Patient:  Patrick Lucas is an 46 y.o., male MRN:  [id number] DOB:  1970/10/19 Patient phone:  [phone] (home)  Patient address:   [address] [place] [address],  Total Time spent with patient: 30 minutes  Date of Admission:  10/28/2016 Date of Discharge: 11/01/16  Reason for Admission:  OD  Principal Problem: MDD (major depressive disorder), single episode, severe [facility]) Discharge Diagnoses: Patient Active Problem List   Diagnosis Date Noted  . Tobacco use disorder [F17.200] 10/29/2016  . Alcohol use disorder, severe, in sustained remission (HCC) [F10.21] 10/29/2016  . MDD (major depressive disorder), single episode, severe (HCC) [F32.2] 10/29/2016  . HTN (hypertension) [I10] 10/29/2016  . Hip pain, chronic [M25.559, G89.29] 10/14/2014   History of Present Illness:   Patrick Lucas is  A 46 yo married,  caucasian male, with no prior psychiatric history,  who was admitted to the behavioral inpatient unit after a suicide attempt by overdose on xanax, ativan and alcohol.    He reports that his depression began over 6 months ago and that the suicidal thoughts had been in the back of his head for a while, until last night when he "just gave up," drank 2-3 beers and took 7 xanax and 9 ativans.  He believes his wife is the one who called 911.  He states that him and his wife have "not been getting along" and that his wife "doesn't trust him."  He reports that this began right around the time that he started getting depressed.  In addition, he has been experiencing stress at work.  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  His PCP had prescribed ativan, xanax, and trazadone prn to help him sleep. The pt reports that he takes the ativan about 3X/week, and the xanax about 2 x/ wk.  He reports that his concentration has been 'okay" and  his appetite has been fine. He denies any homicidal ideation, anxiety, delusions, hallucinations, or symptoms of mania.  The patient reports one prior episode of depression that happened years ago, and he was able to work through it himself by "taking it one day at a time." He has never been diagnosed with depression or any other mental illness.  He has a prior history of alcohol abuse but he has been sober since 2005.[name] He denies any other substance abuse.  He admits to smoking a ppd.    He reports a history of head trauma at age 6, denies hx of seizures or any past suicide attempts.  He denies any hx of child abuse or domestic violence  Today, he reports no suicidal thoughts and his happy that the suicidal attempt did not work.  He is positive and is willing to seek marriage counseling with his wife.   Associated Signs/Symptoms: Depression Symptoms:  depressed mood, suicidal thoughts without plan, suicidal thoughts with specific plan, suicidal attempt, (Hypo) Manic Symptoms:  none present Anxiety Symptoms:  none present Psychotic Symptoms:  none present PTSD Symptoms: none present Total Time spent with patient: 1 hour  Past Psychiatric History: Pt reports one prior episode of depression that happened years ago, has never been diagnosed with a mental illness and has never been on any psychotropics other than xanax and ativan.   Past Medical History:  Past Medical History:  Diagnosis Date  . Allergic rhinitis   .  Anxiety   . Fracture of multiple ribs, right, closed. initial encounter 10/14/2014  . Fracture of one rib of right side, sequela 10/14/2014  . Insomnia   . Sciatica 10/14/2014   History reviewed. No pertinent surgical history.   Family History: History reviewed. No pertinent family history.   Family Psychiatric  History: no family history of mental illnesses or substance abuse.  Tobacco Screening: Have you used any form of tobacco in the last 30 days? 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  He has an older brother and sister that he has some contact with, and is in touch with his mother.  He has one son from a previous relationship that he speaks to about once per month. He found out that he had a son just a couple of years ago when his son was actually 20 years.  Patient has been married twice. He has been with his current wife for 3 years.. History  Alcohol Use  . 1.2 oz/week  . 2 Cans of beer per week     History  Drug Use  . Types: Benzodiazepines    Social History   Social History  . Marital status: Married    Spouse name: N/A  . Number of children: N/A  . Years of education: GED   Occupational History  . Employed    Social History Main Topics  . Smoking status: Current Every Day Smoker    Packs/day: 1.00    Years: 30.00    Types: Cigarettes    Start date: 10/13/1984  . Smokeless tobacco: Never Used  . Alcohol use 1.2 oz/week    2 Cans of beer per week  . Drug use: Yes    Types: Benzodiazepines  . Sexual activity: Yes   Other Topics Concern  . None   Social History Narrative  . None    Hospital Course:     Pt is a 46 yo caucasian male who presents with depression and suicide attempt by xanax and ativan overdose.  Major Depressive Disorder with suicide attempt: pt has been started on fluoxetine 20 mg po q day.Social worker is looking into setting up outpatient care with therapy and psychiatrist for pt upon discharge.   Insomnia: continue  trazodone 150 mg po qPM for  sleep.  Benzodiazepine overdose: no withdrawals present. Continue to monitor patient. No history of abuse  Alcohol use disorder: Patient is states that he has been sober from alcoholism since 2005. He drank a couple beers the night of the OD.  No evidence of withdrawal  Elevated blood pressure and heart rate: continue atenolol 12.5 mg po qd, continue to monitor. BP and HR wnl  Back pain: received  ibuprofen and a Lidoderm patch.  Tobacco Cessation: received continue Nicotine patch of 21 mg  Labs: TSH 0.514 Total Cholesterol 225 HDL 42 LDL 119 Hgb A1C pending   Collateral information: Child psychotherapist spoke with the patient's wife. She said that he has been seeing other women. She states that this was a manipulative way of preventing her from leaving him.  She has removed all guns from the  house.  This hospitalization was uneventful. The patient was very pleasant, calm and cooperative. He participated actively in group therapy. He display appropriate behavior. He had appropriate interactions with peers and with staff.  Today he reports feeling much improved. He denies hopelessness, helplessness problems with sleep appetite, energy or concentration. He denies suicidality or homicidality. He denies auditory or visual hallucinations. He is tolerating medications well. He denies any side effects. He is excited about discharge and feels ready.  Family has been contacted they do not have any concerns about his discharge. Staff feels that the patient is much improved and they do not have any concerns about his safety or the safety of others upon his discharge.   Physical Findings: AIMS:  , ,  ,  ,    CIWA:  CIWA-Ar Total: 0 COWS:     Musculoskeletal: Strength & Muscle Tone: within normal limits Gait & Station: normal Patient leans: N/A  Psychiatric Specialty Exam: Physical Exam  Constitutional: He is oriented to person, place, and time. He appears well-developed and  well-nourished.  HENT:  Head: Normocephalic and atraumatic.  Eyes: Conjunctivae and EOM are normal.  Neck: Normal range of motion.  Respiratory: Effort normal.  Musculoskeletal: Normal range of motion.  Neurological: He is alert and oriented to person, place, and time.    Review of Systems  Constitutional: Negative.   HENT: Negative.   Eyes: Negative.   Respiratory: Negative.   Cardiovascular: Negative.   Gastrointestinal: Negative.   Genitourinary: Negative.   Musculoskeletal: Negative.   Skin: Negative.     Blood pressure 122/87, pulse 77, temperature 97.9 F (36.6 C), temperature source Oral, resp. rate 20, height 6\' 1"  (1.854 m), weight 92.1 kg (203 lb), SpO2 97 %.Body mass index is 26.78 kg/m.  General Appearance: Well Groomed  Eye Contact:  Good  Speech:  Clear and Coherent  Volume:  Normal  Mood:  Euthymic  Affect:  Appropriate and Congruent  Thought Process:  Linear and Descriptions of Associations: Intact  Orientation:  Full (Time, Place, and Person)  Thought Content:  Hallucinations: None  Suicidal Thoughts:  No  Homicidal Thoughts:  No  Memory:  Immediate;   Good Recent;   Good Remote;   Good  Judgement:  Fair  Insight:  Fair  Psychomotor Activity:  Normal  Concentration:  Concentration: Good and Attention Span: Good  Recall:  Good  Fund of Knowledge:  Good  Language:  Good  Akathisia:  No  Handed:    AIMS (if indicated):     Assets:  Communication Skills Physical Health  ADL's:  Intact  Cognition:  WNL  Sleep:  Number of Hours: 7     Have you used any form of tobacco in the last 30 days? (Cigarettes, Smokeless Tobacco, Cigars, and/or Pipes): Yes  Has this patient used any form of tobacco in the last 30 days? (Cigarettes, Smokeless Tobacco, Cigars, and/or Pipes) Yes, Yes, A prescription for an FDA-approved tobacco cessation medication was offered at discharge and the patient refused  Blood Alcohol level:  Lab Results  Component Value Date   [facility]  <5 10/28/2016    Metabolic Disorder Labs:  Lab Results  Component Value Date   HGBA1C 5.6 10/29/2016   MPG 114 10/29/2016   No results found for: PROLACTIN Lab Results  Component Value Date   CHOL 225 (H) 10/29/2016   TRIG 318 (H) 10/29/2016   HDL 42 10/29/2016   CHOLHDL 5.4 10/29/2016   VLDL 64 (H) 10/29/2016   LDLCALC  119 (H) 10/29/2016   Results for Ventura BrunsMCKINNON, Dyshon J (MRN [id number]) as of 10/31/2016 14:52  Ref. Range 10/28/2016 05:09 10/28/2016 05:11 10/29/2016 06:28  COMPREHENSIVE METABOLIC PANEL Unknown Rpt (A)    Sodium Latest Ref Range: 135 - 145 mmol/L 135    Potassium Latest Ref Range: 3.5 - 5.1 mmol/L 3.8    Chloride Latest Ref Range: 101 - 111 mmol/L 104    CO2 Latest Ref Range: 22 - 32 mmol/L 26    Glucose Latest Ref Range: 65 - 99 mg/dL 409104 (H)    Mean Plasma Glucose Latest Units: mg/dL   811114  BUN Latest Ref Range: 6 - 20 mg/dL 18    Creatinine Latest Ref Range: 0.61 - 1.24 mg/dL 9.140.92    Calcium Latest Ref Range: 8.9 - 10.3 mg/dL 8.8 (L)    Anion gap Latest Ref Range: 5 - 15  5    Alkaline Phosphatase Latest Ref Range: 38 - 126 U/L 73    Albumin Latest Ref Range: 3.5 - 5.0 g/dL 4.0    AST Latest Ref Range: 15 - 41 U/L 27    ALT Latest Ref Range: 17 - 63 U/L 24    Total Protein Latest Ref Range: 6.5 - 8.1 g/dL 6.8    Total Bilirubin Latest Ref Range: 0.3 - 1.2 mg/dL 0.5    GFR, Est African American Latest Ref Range: >60 mL/min >60    GFR, Est Non African American Latest Ref Range: >60 mL/min >60    Total CHOL/HDL Ratio Latest Units: RATIO   5.4  Cholesterol Latest Ref Range: 0 - 200 mg/dL   782225 (H)  HDL Cholesterol Latest Ref Range: >40 mg/dL   42  LDL (calc) Latest Ref Range: 0 - 99 mg/dL   956119 (H)  Triglycerides Latest Ref Range: <150 mg/dL   213318 (H)  VLDL Latest Ref Range: 0 - 40 mg/dL   64 (H)  WBC Latest Ref Range: 3.8 - 10.6 K/uL 7.0    RBC Latest Ref Range: 4.40 - 5.90 MIL/uL 5.02    Hemoglobin Latest Ref Range: 13.0 - 18.0 g/dL 08.614.9    HCT Latest Ref Range:  40.0 - 52.0 % 43.2    MCV Latest Ref Range: 80.0 - 100.0 fL 86.1    MCH Latest Ref Range: 26.0 - 34.0 pg 29.8    MCHC Latest Ref Range: 32.0 - 36.0 g/dL 57.834.6    RDW Latest Ref Range: 11.5 - 14.5 % 13.4    Platelets Latest Ref Range: 150 - 440 K/uL 221    Acetaminophen (Tylenol), S Latest Ref Range: 10 - 30 ug/mL <10 (L)    Salicylate Lvl Latest Ref Range: 2.8 - 30.0 mg/dL <4.6<7.0    Hemoglobin N6EA1C Latest Ref Range: 4.8 - 5.6 %   5.6  TSH Latest Ref Range: 0.350 - 4.500 uIU/mL   0.514  Alcohol, Ethyl (B) Latest Ref Range: <5 mg/dL <5    EKG [id number]-MWUX12-LEAD Unknown  Rpt   ED EKG Unknown  Rpt    See Psychiatric Specialty Exam and Suicide Risk Assessment completed by Attending Physician prior to discharge.  Discharge destination:  Home  Is patient on multiple antipsychotic therapies at discharge:  No   Has Patient had three or more failed trials of antipsychotic monotherapy by history:  No  Recommended Plan for Multiple Antipsychotic Therapies: NA   Allergies as of 11/01/2016   No Known Allergies     Medication List    STOP taking these medications  ALPRAZolam 0.5 MG tablet Commonly known as:  XANAX   LORazepam 1 MG tablet Commonly known as:  ATIVAN     TAKE these medications     Indication  atenolol 25 MG tablet Commonly known as:  TENORMIN Take 0.5 tablets (12.5 mg total) by mouth daily.  Indication:  High Blood Pressure Disorder   FLUoxetine 20 MG capsule Commonly known as:  PROZAC Take 1 capsule (20 mg total) by mouth daily.  Indication:  Depression   traZODone 150 MG tablet Commonly known as:  DESYREL Take 1 tablet (150 mg total) by mouth at bedtime as needed for sleep. What changed:  medication strength  how much to take  Indication:  Trouble Sleeping   valACYclovir 1000 MG tablet Commonly known as:  VALTREX Take by mouth.  Indication:  Shingles      Follow-up Information    [facility] Go on 11/04/2016.   Specialty:  Professional  Counselor Why:  Please follow-up with [facility] Monday, 7/9 between the hours of 8:30AM-12 noon for your hospital discharge appointment. Please bring discharge paperwork to discuss continuity of care. Contact information: [facility] [place] [address] [place] [address] [phone]          >30 minutes. >50 % of the time was spent in coordination of care  Signed: Jimmy Footman, MD 11/01/2016, 12:24 PM

## 2016-10-31 NOTE — [facility] Group Notes (Signed)
[facility] LCSW Group Therapy  10/31/2016 1:53 PM  Type of Therapy:  Group Therapy  Participation Level:  Minimal  Participation Quality:  Attentive  Affect:  Appropriate  Cognitive:  Alert  Insight:  Improving  Engagement in Therapy:  Improving  Modes of Intervention:  Activity, Discussion, Education, Problem-solving, Reality Testing, Socialization and Support  Summary of Progress/Problems: Balance in life: Patients will discuss the concept of balance and how it looks and feels to be unbalanced. Pt will identify areas in their life that is unbalanced and ways to become more balanced. They discussed what aspects in their lives has influenced their self care. Patients also discussed self care in the areas of self regulation/control, hygiene/appearance, sleep/relaxation, healthy leisure, healthy eating habits, exercise, inner peace/spirituality, self improvement, sobriety, and health management. They were challenged to identify changes that are needed in order to improve self care.  Patrick Lucas G. Garnette CzechSampson MSW, LCSWA 10/31/2016, 1:53 PM

## 2016-10-31 NOTE — [facility] Suicide Risk Assessment (Signed)
[facility]   Principal Problem: MDD (major depressive disorder), single episode, severe [facility](HCC) Discharge Diagnoses:  Patient Active Problem List   Diagnosis Date Noted  . Tobacco use disorder [F17.200] 10/29/2016  . Alcohol use disorder, severe, in sustained remission (HCC) [F10.21] 10/29/2016  . MDD (major depressive disorder), single episode, severe (HCC) [F32.2] 10/29/2016  . HTN (hypertension) [I10] 10/29/2016  . Hip pain, chronic [M25.559, G89.29] 10/14/2014     Psychiatric Specialty Exam: ROS  Blood pressure 122/87, pulse 77, temperature 97.9 F (36.6 C), temperature source Oral, resp. rate 20, height 6\' 1"  (1.854 m), weight 92.1 kg (203 lb), SpO2 97 %.Body mass index is 26.78 kg/m.                                                       Mental Status Per Nursing Assessment::   On Admission:  NA  Demographic Factors:  Male and Caucasian  Loss Factors: marital problems  Historical Factors: Impulsivity  Risk Reduction Factors:   Sense of responsibility to family, Living with another person, especially a relative and Positive social support  No access to guns  Continued Clinical Symptoms:  Alcohol/Substance Abuse/Dependencies  Cognitive Features That Contribute To Risk:  None    Suicide Risk:  Minimal: No identifiable suicidal ideation.  Patients presenting with no risk factors but with morbid ruminations; may be classified as minimal risk based on the severity of the depressive symptoms  Follow-up Information    [facility]2016.   Specialty:  Professional Counselor Why:  [facility] Monday, 7/9 between the hours of 8:30AM-12 noon for your hospital discharge appointment. Please bring discharge paperwork to discuss continuity of care. Contact information: [facility] [address] [place]  [id number] [phone]            Jimmy FootmanHernandez-Gonzalez,  Nalda Shackleford, MD 11/01/2016, 9:33 AM

## 2016-10-31 NOTE — Progress Notes (Signed)
Recreation Therapy Notes  Date: 07.05.18 Time: 1:00 pm Location: Craft Room  Group Topic: Leisure Education  Goal Area(s) Addresses:  Patient will identify activities for each letter of the alphabet. Patient will verbalize ability to integrate positive leisure into life post d/c. Patient will verbalize ability to use leisure as a Associate Professorcoping skill.  Behavioral Response: Attentive, Interactive  Intervention: Leisure Alphabet  Activity: Patients were given a Leisure Information systems managerAlphabet worksheet and were instructed to write healthy leisure activities for each letter of the alphabet.  Education: LRT educated patients on what they need to participate in leisure.  Education Outcome: Acknowledges education/In group clarification offered   Clinical Observations/Feedback: Patient wrote healthy leisure activities. Patient contributed to group discussion by stating healthy leisure activities.  Jacquelynn CreeGreene,Anzlee Hinesley M, LRT/CTRS 10/31/2016 2:07 PM

## 2016-10-31 NOTE — Plan of Care (Signed)
Problem: Self-Concept: Goal: Ability to verbalize positive feelings about self will improve Outcome: Not Progressing Patient forwards little. Isolates to self

## 2016-10-31 NOTE — [facility] Group Notes (Signed)
[facility] LCSW Group Therapy Note  Type of Therapy and Topic:  Group Therapy:  Goals Group: SMART Goals  Participation Level:  Patient did not attend group. CSW invited patient to group.   Description of Group:   The purpose of a daily goals group is to assist and guide patients in setting recovery/wellness-related goals.  The objective is to set goals as they relate to the crisis in which they were admitted. Patients will be using SMART goal modalities to set measurable goals.  Characteristics of realistic goals will be discussed and patients will be assisted in setting and processing how one will reach their goal. Facilitator will also assist patients in applying interventions and coping skills learned in psycho-education groups to the SMART goal and process how one will achieve defined goal.  Therapeutic Goals: -Patients will develop and document one goal related to or their crisis in which brought them into treatment. -Patients will be guided by LCSW using SMART goal setting modality in how to set a measurable, attainable, realistic and time sensitive goal.  -Patients will process barriers in reaching goal. -Patients will process interventions in how to overcome and successful in reaching goal.   Summary of Patient Progress:  Patient Goal: None identified at this time.    Therapeutic Modalities:   Motivational Interviewing Engineer, manufacturing systemsCognitive Behavioral Therapy Crisis Intervention Model SMART goals setting  Gearl Kimbrough G. Garnette CzechSampson MSW, LCSWA 10/31/2016 1:53 PM

## 2016-11-01 NOTE — Progress Notes (Signed)
Provided and reviewed discharge paperwork and prescriptions. Verified understanding by use of teach back method. Pt verbalizes understanding as well. Denies SI/HI/AVH, pain. Belongings to be returned as noted on discharge. Pt's family member to pick up at 1100.[name]. Safety maintained. Will continue to monitor.

## 2016-11-01 NOTE — Plan of Care (Signed)
Problem: Activity: Goal: Sleeping patterns will improve Outcome: Progressing Patient slept for Estimated Hours of 7; Precautionary checks every 15 minutes for safety maintained, room free of safety hazards, patient sustains no injury or falls during this shift.    

## 2016-11-01 NOTE — [facility] Group Notes (Signed)
[facility] LCSW Group Therapy   11/01/2016 9:30 AM   Type of Therapy: Group Therapy   Participation Level: Active   Participation Quality: Attentive, Sharing and Supportive   Affect: Appropriate   Cognitive: Alert and Oriented   Insight: Developing/Improving and Engaged   Engagement in Therapy: Developing/Improving and Engaged   Modes of Intervention: Clarification, Confrontation, Discussion, Education, Exploration, Limit-setting, Orientation, Problem-solving, Rapport Building, Dance movement psychotherapisteality Testing, Socialization and Support   Summary of Progress/Problems: The topic for today was feelings about relapse. Pt discussed what relapse prevention is to them and identified triggers that they are on the path to relapse. Pt processed their feeling towards relapse and was able to relate to peers. Pt discussed coping skills that can be used for relapse prevention. Patient defined mental health relapse and identified triggers for relapse. He stated strategies to reduce relapse such as taking medications regularly and working on marriage problems.   Hampton AbbotKadijah Quamere Mussell, MSW, LCSW-A 11/01/2016, 10:17AM

## 2016-11-01 NOTE — Progress Notes (Signed)
Patient ID: Patrick Lucas, male   DOB: 04/18/1971, 46 y.o.   MRN: [id number] Improved mood and affect, A&Ox3, c/o 7/10 right mid back pain, Lidocaine Patch applied to area and received Ibuprofen 600 mg at bedtime. Denies SI/HI/SIB/AVH. Patient is sober, cognizant, appears to be at a functioning baseline; anticipating discharge.

## 2017-01-06 ENCOUNTER — Other Ambulatory Visit: Payer: Self-pay | Admitting: Family Medicine

## 2017-01-06 NOTE — Telephone Encounter (Signed)
Please call in lorazepam.  

## 2017-01-07 ENCOUNTER — Other Ambulatory Visit: Payer: Self-pay | Admitting: Family Medicine

## 2017-01-07 NOTE — Telephone Encounter (Signed)
Rx called in to pharmacy. 

## 2017-01-13 ENCOUNTER — Ambulatory Visit
Admission: RE | Admit: 2017-01-13 | Discharge: 2017-01-13 | Disposition: A | Discharge status: Home / Self Care | Payer: 59 | Source: Ambulatory Visit | Attending: Family Medicine | Admitting: Family Medicine

## 2017-01-13 ENCOUNTER — Other Ambulatory Visit
Admission: RE | Admit: 2017-01-13 | Discharge: 2017-01-13 | Disposition: A | Discharge status: Home / Self Care | Payer: 59 | Source: Ambulatory Visit | Attending: Family Medicine | Admitting: Family Medicine

## 2017-01-13 ENCOUNTER — Encounter: Payer: Self-pay | Admitting: Family Medicine

## 2017-01-13 ENCOUNTER — Ambulatory Visit (INDEPENDENT_AMBULATORY_CARE_PROVIDER_SITE_OTHER): Payer: PRIVATE HEALTH INSURANCE | Admitting: Family Medicine

## 2017-01-13 VITALS — BP 100/60 | HR 112 | Temp 98.7°F | Resp 24 | Wt 203.0 lb

## 2017-01-13 DIAGNOSIS — R6883 Chills (without fever): Secondary | ICD-10-CM | POA: Diagnosis not present

## 2017-01-13 DIAGNOSIS — M545 Low back pain, unspecified: Secondary | ICD-10-CM

## 2017-01-13 DIAGNOSIS — M791 Myalgia, unspecified site: Secondary | ICD-10-CM

## 2017-01-13 DIAGNOSIS — R0682 Tachypnea, not elsewhere classified: Secondary | ICD-10-CM | POA: Insufficient documentation

## 2017-01-13 DIAGNOSIS — Z0189 Encounter for other specified special examinations: Secondary | ICD-10-CM | POA: Diagnosis present

## 2017-01-13 LAB — CBC WITH DIFFERENTIAL/PLATELET
Basophils Absolute: 0 10*3/uL (ref 0–0.1)
Basophils Relative: 0 %
EOS PCT: 0 %
Eosinophils Absolute: 0 10*3/uL (ref 0–0.7)
HEMATOCRIT: 42.1 % (ref 40.0–52.0)
Hemoglobin: 14.6 g/dL (ref 13.0–18.0)
LYMPHS ABS: 1.4 10*3/uL (ref 1.0–3.6)
LYMPHS PCT: 14 %
MCH: 29.8 pg (ref 26.0–34.0)
MCHC: 34.6 g/dL (ref 32.0–36.0)
MCV: 86 fL (ref 80.0–100.0)
MONO ABS: 0.8 10*3/uL (ref 0.2–1.0)
MONOS PCT: 9 %
Neutro Abs: 7.7 10*3/uL — ABNORMAL HIGH (ref 1.4–6.5)
Neutrophils Relative %: 77 %
PLATELETS: 204 10*3/uL (ref 150–440)
RBC: 4.9 MIL/uL (ref 4.40–5.90)
RDW: 13.4 % (ref 11.5–14.5)
WBC: 10.1 10*3/uL (ref 3.8–10.6)

## 2017-01-13 LAB — COMPREHENSIVE METABOLIC PANEL
ALT: 48 U/L (ref 17–63)
ANION GAP: 7 (ref 5–15)
AST: 42 U/L — AB (ref 15–41)
Albumin: 3.8 g/dL (ref 3.5–5.0)
Alkaline Phosphatase: 76 U/L (ref 38–126)
BILIRUBIN TOTAL: 0.6 mg/dL (ref 0.3–1.2)
BUN: 16 mg/dL (ref 6–20)
CHLORIDE: 102 mmol/L (ref 101–111)
CO2: 26 mmol/L (ref 22–32)
Calcium: 8.7 mg/dL — ABNORMAL LOW (ref 8.9–10.3)
Creatinine, Ser: 1.11 mg/dL (ref 0.61–1.24)
GFR calc non Af Amer: >60 mL/min (ref >60)
GLUCOSE: 112 mg/dL — AB (ref 65–99)
POTASSIUM: 3.8 mmol/L (ref 3.5–5.1)
Sodium: 135 mmol/L (ref 135–145)
TOTAL PROTEIN: 7.2 g/dL (ref 6.5–8.1)

## 2017-01-13 LAB — POCT INFLUENZA A/B
Influenza A, POC: NEGATIVE
Influenza B, POC: NEGATIVE

## 2017-01-13 LAB — POCT URINALYSIS DIPSTICK
BILIRUBIN UA: NEGATIVE
Blood, UA: NEGATIVE
Glucose, UA: NEGATIVE
Ketones, UA: NEGATIVE
LEUKOCYTES UA: NEGATIVE
Nitrite, UA: NEGATIVE
SPEC GRAV UA: 1.02 (ref 1.010–1.025)
Urobilinogen, UA: 0.2 E.U./dL
pH, UA: 6 (ref 5.0–8.0)

## 2017-01-13 MED ORDER — DOXYCYCLINE HYCLATE 100 MG PO TABS: 100.0000 mg | ORAL_TABLET | Freq: Two times a day (BID) | ORAL | 0 refills | Status: DC

## 2017-01-13 NOTE — Patient Instructions (Signed)
   Go to the outpatient lab at [facility] for blood work, then to outpatient radiology for chest XR

## 2017-01-13 NOTE — Progress Notes (Signed)
Patient: Patrick Lucas Male    DOB: 05/12/1970   46 y.o.   MRN: [id number] Visit Date: 01/13/2017  Today's Provider: Mila Merry, MD   Chief Complaint  Patient presents with  . Generalized Body Aches   Subjective:    HPI Body aches:  Patient presents today complain of body aches for the past 3 days. Patient has also had headache, chills, sweats, nausea, fatigue, abdominal cramps and lower back pain. Patient reports the symptoms are unchanged since onset. Patient has tried taking Pepto Bismol and Tylenol for symptoms without relief. No sore throat and no cough. No rashes, no known tick bites. No recent travel outside of [place]. No known ID exposures.      No Known Allergies   Current Outpatient Prescriptions:  .  LORazepam (ATIVAN) 1 MG tablet, TAKE 1 TO 2 TABLETS BY MOUTH AT BEDTIME AS NEEDED ANXIETY, Disp: 90 tablet, Rfl: 1 .  valACYclovir (VALTREX) 1000 MG tablet, Take by mouth., Disp: , Rfl:  .  atenolol (TENORMIN) 25 MG tablet, Take 0.5 tablets (12.5 mg total) by mouth daily. (Patient not taking: Reported on 01/13/2017), Disp: 30 tablet, Rfl: 0 .  FLUoxetine (PROZAC) 20 MG capsule, Take 1 capsule (20 mg total) by mouth daily. (Patient not taking: Reported on 01/13/2017), Disp: 30 capsule, Rfl: 0 .  traZODone (DESYREL) 150 MG tablet, Take 1 tablet (150 mg total) by mouth at bedtime as needed for sleep. (Patient not taking: Reported on 01/13/2017), Disp: 30 tablet, Rfl: 0  Review of Systems  Constitutional: Positive for chills, diaphoresis and fatigue. Negative for appetite change and fever.  HENT: Negative for congestion, nosebleeds, rhinorrhea, sinus pain, sinus pressure, sneezing, sore throat and tinnitus.   Respiratory: Negative for chest tightness, shortness of breath and wheezing.   Cardiovascular: Negative for chest pain and palpitations.  Gastrointestinal: Positive for abdominal pain (cramps) and nausea. Negative for vomiting.  Neurological: Positive  for headaches.    Social History  Substance Use Topics  . Smoking status: Current Every Day Smoker    Packs/day: 1.00    Years: 30.00    Types: Cigarettes    Start date: 10/13/1984  . Smokeless tobacco: Never Used  . Alcohol use No   Objective:   BP 100/60 (BP Location: Left Arm, Patient Position: Sitting, Cuff Size: Normal)   Pulse (!) 112   Temp 98.7 F (37.1 C) (Oral)   Resp (!) 24 Comment: labored  Wt 203 lb (92.1 kg)   SpO2 96% Comment: room air  BMI 26.78 kg/m  Vitals:   01/13/17 1042  Weight: 203 lb (92.1 kg)     Physical Exam  General Appearance:    Alert, cooperative, mildly ill appearing, no distress  HENT:   ENT exam normal, no neck nodes or sinus tenderness  Eyes:    PERRL, conjunctiva/corneas clear, EOM's intact       Lungs:     Clear to auscultation bilaterally, respirations unlabored  Heart:    Regular rate and rhythm  Neurologic:   Awake, alert, oriented x 3. No apparent focal neurological           defect.   Abd:   Soft, non tender, no masses. No CVAT   Results for orders placed or performed in visit on 01/13/17  POCT Influenza A/B  Result Value Ref Range   Influenza A, POC Negative Negative   Influenza B, POC Negative Negative  POCT Urinalysis Dipstick  Result Value Ref Range  Color, UA amber    Clarity, UA clear    Glucose, UA negative    Bilirubin, UA negative    Ketones, UA negative    Spec Grav, UA 1.020 1.010 - 1.025   Blood, UA Negative    pH, UA 6.0 5.0 - 8.0   Protein, UA Trace    Urobilinogen, UA 0.2 0.2 or 1.0 E.U./dL   Nitrite, UA Negative    Leukocytes, UA Negative Negative       Assessment & Plan:     1. Chills  - POCT Influenza A/B - doxycycline (VIBRA-TABS) 100 MG tablet; Take 1 tablet (100 mg total) by mouth 2 (two) times daily.  Dispense: 20 tablet; Refill: 0  2. Myalgia  - POCT Influenza A/B - CBC with Differential/Platelet - COMPLETE METABOLIC PANEL WITH GFR - doxycycline (VIBRA-TABS) 100 MG tablet; Take 1  tablet (100 mg total) by mouth 2 (two) times daily.  Dispense: 20 tablet; Refill: 0 - DG Chest 2 View; Future  3. Tachypnea  - DG Chest 2 View; Future  4. Acute low back pain without sciatica, unspecified back pain laterality  - POCT Urinalysis Dipstick       Mila Merry, MD  [facility]

## 2017-04-03 ENCOUNTER — Other Ambulatory Visit: Payer: Self-pay | Admitting: Family Medicine

## 2017-04-03 NOTE — Progress Notes (Deleted)
       Patient: Patrick Lucas J Sotto Male    DOB: 07/27/1970   46 y.o.   MRN: [id number] Visit Date: 04/03/2017  Today's Provider: Mila Merryonald Fisher, MD   No chief complaint on file.  Subjective:    HPI   Other specified anxiety disorders From 03/30/2015-Started alprazolam to take as needed during day, not to mix with lorazapam hs.   Depression From 03/30/2015-Started escitalopram (LEXAPRO) 10 MG tablet.  Call if symptoms change or if not rapidly improving.    No Known Allergies   Current Outpatient Medications:  .  atenolol (TENORMIN) 25 MG tablet, Take 0.5 tablets (12.5 mg total) by mouth daily. (Patient not taking: Reported on 01/13/2017), Disp: 30 tablet, Rfl: 0 .  doxycycline (VIBRA-TABS) 100 MG tablet, Take 1 tablet (100 mg total) by mouth 2 (two) times daily., Disp: 20 tablet, Rfl: 0 .  FLUoxetine (PROZAC) 20 MG capsule, Take 1 capsule (20 mg total) by mouth daily. (Patient not taking: Reported on 01/13/2017), Disp: 30 capsule, Rfl: 0 .  LORazepam (ATIVAN) 1 MG tablet, TAKE 1 TO 2 TABLETS BY MOUTH AT BEDTIME AS NEEDED ANXIETY, Disp: 90 tablet, Rfl: 1 .  traZODone (DESYREL) 150 MG tablet, Take 1 tablet (150 mg total) by mouth at bedtime as needed for sleep. (Patient not taking: Reported on 01/13/2017), Disp: 30 tablet, Rfl: 0 .  valACYclovir (VALTREX) 1000 MG tablet, Take by mouth., Disp: , Rfl:   Review of Systems  Constitutional: Negative for appetite change, chills and fever.  Respiratory: Negative for chest tightness, shortness of breath and wheezing.   Cardiovascular: Negative for chest pain and palpitations.  Gastrointestinal: Negative for abdominal pain, nausea and vomiting.    Social History   Tobacco Use  . Smoking status: Current Every Day Smoker    Packs/day: 1.00    Years: 30.00    Pack years: 30.00    Types: Cigarettes    Start date: 10/13/1984  . Smokeless tobacco: Never Used  Substance Use Topics  . Alcohol use: No    Alcohol/week: 1.2 oz    Types: 2 Cans  of beer per week   Objective:   There were no vitals taken for this visit. There were no vitals filed for this visit.   Physical Exam      Assessment & Plan:           Mila Merryonald Fisher, MD  [facility]

## 2017-04-03 NOTE — Telephone Encounter (Signed)
Pharmacy requesting refills. Thanks!  

## 2017-04-04 ENCOUNTER — Ambulatory Visit: Payer: PRIVATE HEALTH INSURANCE | Admitting: Family Medicine

## 2017-08-21 ENCOUNTER — Other Ambulatory Visit: Payer: Self-pay | Admitting: Family Medicine

## 2017-08-21 MED ORDER — ALPRAZOLAM 0.5 MG PO TABS: ORAL_TABLET | ORAL | 3 refills | Status: DC

## 2017-08-21 NOTE — Telephone Encounter (Signed)
[facility] [place]

## 2017-08-25 ENCOUNTER — Other Ambulatory Visit: Payer: Self-pay | Admitting: Family Medicine

## 2018-02-17 ENCOUNTER — Ambulatory Visit (INDEPENDENT_AMBULATORY_CARE_PROVIDER_SITE_OTHER): Payer: PRIVATE HEALTH INSURANCE | Admitting: Family Medicine

## 2018-02-17 ENCOUNTER — Encounter: Payer: Self-pay | Admitting: Family Medicine

## 2018-02-17 VITALS — BP 144/90 | HR 97 | Temp 97.9°F | Wt 205.2 lb

## 2018-02-17 DIAGNOSIS — J3489 Other specified disorders of nose and nasal sinuses: Secondary | ICD-10-CM

## 2018-02-17 DIAGNOSIS — F419 Anxiety disorder, unspecified: Secondary | ICD-10-CM | POA: Diagnosis not present

## 2018-02-17 DIAGNOSIS — G4700 Insomnia, unspecified: Secondary | ICD-10-CM

## 2018-02-17 MED ORDER — ALPRAZOLAM 0.5 MG PO TABS: ORAL_TABLET | ORAL | 3 refills | Status: DC

## 2018-02-17 MED ORDER — CEPHALEXIN 500 MG PO CAPS: 500.0000 mg | ORAL_CAPSULE | Freq: Four times a day (QID) | ORAL | 0 refills | Status: AC

## 2018-02-17 MED ORDER — LORAZEPAM 1 MG PO TABS: ORAL_TABLET | ORAL | 1 refills | Status: DC

## 2018-02-17 NOTE — Progress Notes (Signed)
Patient: Patrick Lucas Male    DOB: [birth year]   47 y.o.   MRN: [id number] Visit Date: 02/17/2018  Today's Provider: Mila Merry, MD   Chief Complaint  Patient presents with  . Nose Problem   Subjective:    HPI Nose Problem  Patient presents today with soreness in right nostril for one month . Patient states that the sore is tender and has swelling in right nostril. Patient states he applied antibiotic ointment for a few days which didn't help. Not using any OTC nasal sprays. No known injuries. Has had some bleeding in both nostrils off and on.   He also states he has more stress, anxiety and difficulty sleeping the last few months. Has taken alprazolam and lorazepam intermittently, but prescription currently out and he requests refill.    No Known Allergies   Current Outpatient Medications:  .  ALPRAZolam (XANAX) 0.5 MG tablet, TAKE 1/2 TO 1 TABLET BY MOUTH EVERY 4 HOURS AS NEEDED, Disp: 30 tablet, Rfl: 3 .  atenolol (TENORMIN) 25 MG tablet, Take 0.5 tablets (12.5 mg total) by mouth daily., Disp: 30 tablet, Rfl: 0 .  doxycycline (VIBRA-TABS) 100 MG tablet, Take 1 tablet (100 mg total) by mouth 2 (two) times daily., Disp: 20 tablet, Rfl: 0 .  FLUoxetine (PROZAC) 20 MG capsule, Take 1 capsule (20 mg total) by mouth daily., Disp: 30 capsule, Rfl: 0 .  LORazepam (ATIVAN) 1 MG tablet, TAKE 1 TO 2 TABLETS BY MOUTH AT BEDTIME AS NEEDED FOR ANXIETY, Disp: 90 tablet, Rfl: 1 .  traZODone (DESYREL) 150 MG tablet, Take 1 tablet (150 mg total) by mouth at bedtime as needed for sleep., Disp: 30 tablet, Rfl: 0 .  valACYclovir (VALTREX) 1000 MG tablet, Take by mouth., Disp: , Rfl:   Review of Systems  Constitutional: Negative.   HENT: Positive for nosebleeds.   Eyes: Negative.   Respiratory: Negative.   Gastrointestinal: Negative.   Musculoskeletal: Negative.     Social History   Tobacco Use  . Smoking status: Current Every Day Smoker    Packs/day: 1.00    Years: 30.00   Pack years: 30.00    Types: Cigarettes    Start date: 10/13/1984  . Smokeless tobacco: Never Used  Substance Use Topics  . Alcohol use: No    Alcohol/week: 2.0 standard drinks    Types: 2 Cans of beer per week   Objective:   BP (!) 144/90 (BP Location: Right Arm, Patient Position: Sitting, Cuff Size: Normal)   Pulse 97   Temp 97.9 F (36.6 C) (Oral)   Wt 205 lb 3.2 oz (93.1 kg)   SpO2 95%   BMI 27.07 kg/m  Vitals:   02/17/18 1358  BP: (!) 144/90  Pulse: 97  Temp: 97.9 F (36.6 C)  TempSrc: Oral  SpO2: 95%  Weight: 205 lb 3.2 oz (93.1 kg)     Physical Exam  General Appearance:    Alert, cooperative, no distress  HENT:   Tender and slightly swollen right nostril near bridge of nose. Turbinates congested, limiting visual exam, but no lesions appreciated. No drainage.   Eyes:    PERRL, conjunctiva/corneas clear, EOM's intact       Lungs:     Clear to auscultation bilaterally, respirations unlabored  Heart:    Regular rate and rhythm  Neurologic:   Awake, alert, oriented x 3. No apparent focal neurological           defect.  Assessment & Plan:     1. Nasal pain Exam limited by congestion. Cover with antibiotic and call for ENT referral if not much better next week.  - cephALEXin (KEFLEX) 500 MG capsule; Take 1 capsule (500 mg total) by mouth 4 (four) times daily for 10 days.  Dispense: 40 capsule; Refill: 0  2. Anxiety refill- ALPRAZolam (XANAX) 0.5 MG tablet; TAKE 1/2 TO 1 TABLET BY MOUTH EVERY 4 HOURS AS NEEDED  Dispense: 30 tablet; Refill: 3  3. Insomnia, unspecified type Refill.  - LORazepam (ATIVAN) 1 MG tablet; TAKE 1 TO 2 TABLETS BY MOUTH AT BEDTIME AS NEEDED FOR ANXIETY  Dispense: 90 tablet; Refill: 1       Mila Merry, MD  [facility]

## 2019-02-03 ENCOUNTER — Telehealth: Payer: Self-pay | Admitting: Family Medicine

## 2019-02-03 NOTE — Telephone Encounter (Signed)
Patient was seen in ED for diverticulitis on 07/17/18, left message for patient to call back so I can have more information about his current situation/symptoms. KW

## 2019-02-03 NOTE — Telephone Encounter (Signed)
Pt returned call ° °teri °

## 2019-02-03 NOTE — Telephone Encounter (Signed)
[id number] 

## 2019-02-03 NOTE — Telephone Encounter (Signed)
Pt called saying his diverticulitis has flaired up and he wants to know if we can send the medications to the pharmacy that  He takes when this happens  CB#  [phone]  [facility] [place]

## 2019-02-10 NOTE — Telephone Encounter (Signed)
[id number] 

## 2019-02-15 NOTE — Telephone Encounter (Signed)
Tried calling patient. Left message to call back. Letter mailed to patient home.   Dr. Caryn Section, I wanted to make you aware that we have tried reaching this patient, but have not been able to speak with him about symptoms.

## 2019-02-25 ENCOUNTER — Emergency Department
Admission: EM | Admit: 2019-02-25 | Discharge: 2019-02-25 | Disposition: A | Discharge status: Home / Self Care | Payer: Self-pay | Attending: Emergency Medicine | Admitting: Emergency Medicine

## 2019-02-25 ENCOUNTER — Other Ambulatory Visit: Payer: Self-pay

## 2019-02-25 ENCOUNTER — Encounter: Payer: Self-pay | Admitting: Emergency Medicine

## 2019-02-25 DIAGNOSIS — L0231 Cutaneous abscess of buttock: Secondary | ICD-10-CM | POA: Insufficient documentation

## 2019-02-25 DIAGNOSIS — F17210 Nicotine dependence, cigarettes, uncomplicated: Secondary | ICD-10-CM | POA: Insufficient documentation

## 2019-02-25 DIAGNOSIS — I10 Essential (primary) hypertension: Secondary | ICD-10-CM | POA: Insufficient documentation

## 2019-02-25 DIAGNOSIS — Z79899 Other long term (current) drug therapy: Secondary | ICD-10-CM | POA: Insufficient documentation

## 2019-02-25 DIAGNOSIS — L03317 Cellulitis of buttock: Secondary | ICD-10-CM | POA: Insufficient documentation

## 2019-02-25 MED ORDER — SULFAMETHOXAZOLE-TRIMETHOPRIM 800-160 MG PO TABS: 1.0000 | ORAL_TABLET | Freq: Two times a day (BID) | ORAL | 0 refills | Status: DC

## 2019-02-25 MED ORDER — HYDROCODONE-ACETAMINOPHEN 5-325 MG PO TABS: 1.0000 | ORAL_TABLET | Freq: Four times a day (QID) | ORAL | 0 refills | Status: DC | PRN

## 2019-02-25 NOTE — ED Notes (Signed)
See triage note  Presents with possible spider bite   Has red and sore area to buttocks

## 2019-02-25 NOTE — ED Provider Notes (Signed)
[facility] Emergency Department Provider Note  ____________________________________________   First MD Initiated Contact with Patient 02/25/19 1044     (approximate)  I have reviewed the triage vital signs and the nursing notes.   HISTORY  Chief Complaint Insect Bite   HPI Patrick Lucas is a 48 y.o. male presents to the ED with complaint of possible spider bite to his left buttocks.  Patient states that there was an area that developed on Friday and is continued to get more red and painful.  Patient did not see any insect prior to this.  He denies any fever or chills.  He rates his pain as an 8 out of 10.     Past Medical History:  Diagnosis Date  . Allergic rhinitis   . Anxiety   . Fracture of multiple ribs, right, closed. initial encounter 10/14/2014  . Fracture of one rib of right side, sequela 10/14/2014  . Insomnia   . Sciatica 10/14/2014    Patient Active Problem List   Diagnosis Date Noted  . Tobacco use disorder 10/29/2016  . Alcohol use disorder, severe, in sustained remission ([place]) 10/29/2016  . MDD (major depressive disorder), single episode, severe ([place]) 10/29/2016  . HTN (hypertension) 10/29/2016  . Hip pain, chronic 10/14/2014    History reviewed. No pertinent surgical history.  Prior to Admission medications   Medication Sig Start Date End Date Taking? Authorizing Provider  ALPRAZolam Duanne Moron) 0.5 MG tablet TAKE 1/2 TO 1 TABLET BY MOUTH EVERY 4 HOURS AS NEEDED 02/17/18   Birdie Sons, MD  atenolol (TENORMIN) 25 MG tablet Take 0.5 tablets (12.5 mg total) by mouth daily. 11/01/16   Hildred Priest, MD  FLUoxetine (PROZAC) 20 MG capsule Take 1 capsule (20 mg total) by mouth daily. 11/01/16   Hildred Priest, MD  HYDROcodone-acetaminophen (NORCO/VICODIN) 5-325 MG tablet Take 1 tablet by mouth every 6 (six) hours as needed for moderate pain. 02/25/19   Johnn Hai, PA-C  LORazepam (ATIVAN) 1 MG tablet TAKE 1  TO 2 TABLETS BY MOUTH AT BEDTIME AS NEEDED FOR ANXIETY 02/17/18   Birdie Sons, MD  sulfamethoxazole-trimethoprim (BACTRIM DS) 800-160 MG tablet Take 1 tablet by mouth 2 (two) times daily. 02/25/19   Johnn Hai, PA-C  traZODone (DESYREL) 150 MG tablet Take 1 tablet (150 mg total) by mouth at bedtime as needed for sleep. 10/31/16   Hildred Priest, MD  valACYclovir (VALTREX) 1000 MG tablet Take by mouth. 06/11/10   [provider]    Allergies Patient has no known allergies.  No family history on file.  Social History Social History   Tobacco Use  . Smoking status: Current Every Day Smoker    Packs/day: 1.00    Years: 30.00    Pack years: 30.00    Types: Cigarettes    Start date: 10/13/1984  . Smokeless tobacco: Never Used  Substance Use Topics  . Alcohol use: No    Alcohol/week: 2.0 standard drinks    Types: 2 Cans of beer per week  . Drug use: Yes    Types: Benzodiazepines    Review of Systems Constitutional: No fever/chills Eyes: No visual changes. Cardiovascular: Denies chest pain. Respiratory: Denies shortness of breath. Gastrointestinal: No abdominal pain.  No nausea, no vomiting. Musculoskeletal: Negative for back pain. Skin: Positive for rash left hip. Neurological: Negative for headaches, focal weakness or numbness. ____________________________________________   PHYSICAL EXAM:  VITAL SIGNS: ED Triage Vitals  Enc Vitals Group  BP 02/25/19 1026 (!) 158/92     Pulse Rate 02/25/19 1026 (!) 105     Resp 02/25/19 1026 16     Temp 02/25/19 1026 98 F (36.7 C)     Temp Source 02/25/19 1026 Oral     SpO2 02/25/19 1026 98 %     Weight 02/25/19 1026 205 lb (93 kg)     Height 02/25/19 1026 6' (1.829 m)     Head Circumference --      Peak Flow --      Pain Score 02/25/19 1029 8     Pain Loc --      Pain Edu? --      Excl. in GC? --    Constitutional: Alert and oriented. Well appearing and in no acute distress. Eyes: Conjunctivae  are normal.  Head: Atraumatic. Neck: No stridor.   Cardiovascular: Normal rate, regular rhythm. Grossly normal heart sounds.  Good peripheral circulation. Respiratory: Normal respiratory effort.  No retractions. Lungs CTAB. Musculoskeletal: Moves upper and lower extremities without any difficulty.  Normal gait was noted. Neurologic:  Normal speech and language. No gross focal neurologic deficits are appreciated. No gait instability. Skin:  Skin is warm, dry and intact.  Examination of left buttocks there is an erythematous area that is approximately 4 cm in diameter that is firm and moderately tender to touch.  Area is warm.  No drainage is noted. Psychiatric: Mood and affect are normal. Speech and behavior are normal.  ____________________________________________   LABS (all labs ordered are listed, but only abnormal results are displayed)  Labs Reviewed - No data to display _ PROCEDURES  Procedure(s) performed (including Critical Care):  Procedures  ____________________________________________   INITIAL IMPRESSION / ASSESSMENT AND PLAN / ED COURSE  As part of my medical decision making, I reviewed the following data within the electronic MEDICAL RECORD NUMBER Notes from prior ED visits and [place] Controlled Substance Database  48 year old male presents to the ED with possible insect bite to his left buttocks.  Patient states that 5 days ago he noticed an area on his left buttocks that was swollen, red, and painful to touch.  Patient denies any fever or chills.  He was given a prescription for Bactrim DS twice daily for 10 days and Norco if needed for pain.  He will continue using warm compresses to the area and in the next 2 days if it is not opened and drained on its own he will return to the emergency department for possible I&D.  ____________________________________________   FINAL CLINICAL IMPRESSION(S) / ED DIAGNOSES  Final diagnoses:  Cellulitis and abscess of buttock     ED  Discharge Orders         Ordered    sulfamethoxazole-trimethoprim (BACTRIM DS) 800-160 MG tablet  2 times daily     02/25/19 1203    HYDROcodone-acetaminophen (NORCO/VICODIN) 5-325 MG tablet  Every 6 hours PRN     02/25/19 1203           Note:  This document was prepared using Dragon voice recognition software and may include unintentional dictation errors.    Tommi Rumps, PA-C 02/25/19 1548    Sharyn Creamer, MD 02/25/19 1606

## 2019-02-25 NOTE — ED Triage Notes (Signed)
Pt states possible spider bite last Friday to left buttock. Area red, swollen and painful per pt. NAD.

## 2019-09-25 ENCOUNTER — Other Ambulatory Visit: Payer: Self-pay

## 2019-09-25 ENCOUNTER — Emergency Department: Payer: Self-pay

## 2019-09-25 DIAGNOSIS — Z5321 Procedure and treatment not carried out due to patient leaving prior to being seen by health care provider: Secondary | ICD-10-CM | POA: Insufficient documentation

## 2019-09-25 DIAGNOSIS — R1032 Left lower quadrant pain: Secondary | ICD-10-CM | POA: Insufficient documentation

## 2019-09-25 LAB — CBC
HCT: 43.8 % (ref 39.0–52.0)
Hemoglobin: 15 g/dL (ref 13.0–17.0)
MCH: 30.2 pg (ref 26.0–34.0)
MCHC: 34.2 g/dL (ref 30.0–36.0)
MCV: 88.1 fL (ref 80.0–100.0)
Platelets: 281 10*3/uL (ref 150–400)
RBC: 4.97 MIL/uL (ref 4.22–5.81)
RDW: 12.4 % (ref 11.5–15.5)
WBC: 8.6 10*3/uL (ref 4.0–10.5)
nRBC: 0 % (ref 0.0–0.2)

## 2019-09-25 LAB — URINALYSIS, COMPLETE (UACMP) WITH MICROSCOPIC
Bacteria, UA: NONE SEEN
Bilirubin Urine: NEGATIVE
Glucose, UA: NEGATIVE mg/dL
Ketones, ur: NEGATIVE mg/dL
Leukocytes,Ua: NEGATIVE
Nitrite: NEGATIVE
Protein, ur: 30 mg/dL — AB
RBC / HPF: >50 RBC/hpf — ABNORMAL HIGH (ref 0–5)
Specific Gravity, Urine: 1.018 (ref 1.005–1.030)
Squamous Epithelial / LPF: NONE SEEN (ref 0–5)
pH: 6 (ref 5.0–8.0)

## 2019-09-25 LAB — BASIC METABOLIC PANEL
Anion gap: 10 (ref 5–15)
BUN: 19 mg/dL (ref 6–20)
CO2: 23 mmol/L (ref 22–32)
Calcium: 8.9 mg/dL (ref 8.9–10.3)
Chloride: 104 mmol/L (ref 98–111)
Creatinine, Ser: 1.09 mg/dL (ref 0.61–1.24)
GFR calc Af Amer: >60 mL/min (ref >60)
GFR calc non Af Amer: >60 mL/min (ref >60)
Glucose, Bld: 109 mg/dL — ABNORMAL HIGH (ref 70–99)
Potassium: 3.5 mmol/L (ref 3.5–5.1)
Sodium: 137 mmol/L (ref 135–145)

## 2019-09-25 MED ORDER — OXYCODONE-ACETAMINOPHEN 5-325 MG PO TABS
1.0000 | ORAL_TABLET | ORAL | Status: DC | PRN
  Administered 2019-09-25: 1 via ORAL
  Filled 2019-09-25: qty 1

## 2019-09-25 NOTE — ED Triage Notes (Signed)
Pt states L sided flank pain. Thinks he has a kidney stone. Denies hematuria. States painful urination. Appears uncomfortable. Denies NV.

## 2019-09-25 NOTE — ED Notes (Signed)
Patient to stat desk in no acute distress asking about wait time. Patient given update on wait time. Patient verbalizes understanding.  

## 2019-09-26 ENCOUNTER — Emergency Department
Admission: EM | Admit: 2019-09-26 | Discharge: 2019-09-26 | Disposition: A | Discharge status: Home / Self Care | Payer: Self-pay | Attending: Emergency Medicine | Admitting: Emergency Medicine

## 2019-11-11 ENCOUNTER — Encounter: Payer: Self-pay | Admitting: Physician Assistant

## 2019-11-11 ENCOUNTER — Ambulatory Visit: Payer: BC Managed Care – PPO | Admitting: Physician Assistant

## 2019-11-11 ENCOUNTER — Ambulatory Visit
Admission: RE | Admit: 2019-11-11 | Discharge: 2019-11-11 | Disposition: A | Discharge status: Home / Self Care | Payer: BC Managed Care – PPO | Source: Ambulatory Visit | Attending: Physician Assistant | Admitting: Physician Assistant

## 2019-11-11 ENCOUNTER — Other Ambulatory Visit: Payer: Self-pay

## 2019-11-11 ENCOUNTER — Ambulatory Visit
Admission: RE | Admit: 2019-11-11 | Discharge: 2019-11-11 | Disposition: A | Discharge status: Home / Self Care | Payer: BC Managed Care – PPO | Attending: Physician Assistant | Admitting: Physician Assistant

## 2019-11-11 VITALS — BP 132/92 | HR 120 | Temp 97.4°F | Resp 16 | Wt 190.8 lb

## 2019-11-11 DIAGNOSIS — S4991XA Unspecified injury of right shoulder and upper arm, initial encounter: Secondary | ICD-10-CM | POA: Diagnosis not present

## 2019-11-11 DIAGNOSIS — M25511 Pain in right shoulder: Secondary | ICD-10-CM | POA: Insufficient documentation

## 2019-11-11 MED ORDER — METHYLPREDNISOLONE 4 MG PO TBPK: ORAL_TABLET | ORAL | 0 refills | Status: DC

## 2019-11-11 MED ORDER — MELOXICAM 15 MG PO TABS: 15.0000 mg | ORAL_TABLET | Freq: Every day | ORAL | 0 refills | Status: DC

## 2019-11-11 NOTE — Patient Instructions (Signed)
Shoulder Exercises Ask your health care provider which exercises are safe for you. Do exercises exactly as told by your health care provider and adjust them as directed. It is normal to feel mild stretching, pulling, tightness, or discomfort as you do these exercises. Stop right away if you feel sudden pain or your pain gets worse. Do not begin these exercises until told by your health care provider. Stretching exercises External rotation and abduction This exercise is sometimes called corner stretch. This exercise rotates your arm outward (external rotation) and moves your arm out from your body (abduction). 1. Stand in a doorway with one of your feet slightly in front of the other. This is called a staggered stance. If you cannot reach your forearms to the door frame, stand facing a corner of a room. 2. Choose one of the following positions as told by your health care provider: ? Place your hands and forearms on the door frame above your head. ? Place your hands and forearms on the door frame at the height of your head. ? Place your hands on the door frame at the height of your elbows. 3. Slowly move your weight onto your front foot until you feel a stretch across your chest and in the front of your shoulders. Keep your head and chest upright and keep your abdominal muscles tight. 4. Hold for __________ seconds. 5. To release the stretch, shift your weight to your back foot. Repeat __________ times. Complete this exercise __________ times a day. Extension, standing 1. Stand and hold a broomstick, a cane, or a similar object behind your back. ? Your hands should be a little wider than shoulder width apart. ? Your palms should face away from your back. 2. Keeping your elbows straight and your shoulder muscles relaxed, move the stick away from your body until you feel a stretch in your shoulders (extension). ? Avoid shrugging your shoulders while you move the stick. Keep your shoulder blades tucked  down toward the middle of your back. 3. Hold for __________ seconds. 4. Slowly return to the starting position. Repeat __________ times. Complete this exercise __________ times a day. Range-of-motion exercises Pendulum  1. Stand near a wall or a surface that you can hold onto for balance. 2. Bend at the waist and let your left / right arm hang straight down. Use your other arm to support you. Keep your back straight and do not lock your knees. 3. Relax your left / right arm and shoulder muscles, and move your hips and your trunk so your left / right arm swings freely. Your arm should swing because of the motion of your body, not because you are using your arm or shoulder muscles. 4. Keep moving your hips and trunk so your arm swings in the following directions, as told by your health care provider: ? Side to side. ? Forward and backward. ? In clockwise and counterclockwise circles. 5. Continue each motion for __________ seconds, or for as long as told by your health care provider. 6. Slowly return to the starting position. Repeat __________ times. Complete this exercise __________ times a day. Shoulder flexion, standing  1. Stand and hold a broomstick, a cane, or a similar object. Place your hands a little more than shoulder width apart on the object. Your left / right hand should be palm up, and your other hand should be palm down. 2. Keep your elbow straight and your shoulder muscles relaxed. Push the stick up with your healthy arm to   raise your left / right arm in front of your body, and then over your head until you feel a stretch in your shoulder (flexion). ? Avoid shrugging your shoulder while you raise your arm. Keep your shoulder blade tucked down toward the middle of your back. 3. Hold for __________ seconds. 4. Slowly return to the starting position. Repeat __________ times. Complete this exercise __________ times a day. Shoulder abduction, standing 1. Stand and hold a broomstick,  a cane, or a similar object. Place your hands a little more than shoulder width apart on the object. Your left / right hand should be palm up, and your other hand should be palm down. 2. Keep your elbow straight and your shoulder muscles relaxed. Push the object across your body toward your left / right side. Raise your left / right arm to the side of your body (abduction) until you feel a stretch in your shoulder. ? Do not raise your arm above shoulder height unless your health care provider tells you to do that. ? If directed, raise your arm over your head. ? Avoid shrugging your shoulder while you raise your arm. Keep your shoulder blade tucked down toward the middle of your back. 3. Hold for __________ seconds. 4. Slowly return to the starting position. Repeat __________ times. Complete this exercise __________ times a day. Internal rotation  1. Place your left / right hand behind your back, palm up. 2. Use your other hand to dangle an exercise band, a towel, or a similar object over your shoulder. Grasp the band with your left / right hand so you are holding on to both ends. 3. Gently pull up on the band until you feel a stretch in the front of your left / right shoulder. The movement of your arm toward the center of your body is called internal rotation. ? Avoid shrugging your shoulder while you raise your arm. Keep your shoulder blade tucked down toward the middle of your back. 4. Hold for __________ seconds. 5. Release the stretch by letting go of the band and lowering your hands. Repeat __________ times. Complete this exercise __________ times a day. Strengthening exercises External rotation  1. Sit in a stable chair without armrests. 2. Secure an exercise band to a stable object at elbow height on your left / right side. 3. Place a soft object, such as a folded towel or a small pillow, between your left / right upper arm and your body to move your elbow about 4 inches (10 cm) away  from your side. 4. Hold the end of the exercise band so it is tight and there is no slack. 5. Keeping your elbow pressed against the soft object, slowly move your forearm out, away from your abdomen (external rotation). Keep your body steady so only your forearm moves. 6. Hold for __________ seconds. 7. Slowly return to the starting position. Repeat __________ times. Complete this exercise __________ times a day. Shoulder abduction  1. Sit in a stable chair without armrests, or stand up. 2. Hold a __________ weight in your left / right hand, or hold an exercise band with both hands. 3. Start with your arms straight down and your left / right palm facing in, toward your body. 4. Slowly lift your left / right hand out to your side (abduction). Do not lift your hand above shoulder height unless your health care provider tells you that this is safe. ? Keep your arms straight. ? Avoid shrugging your shoulder while you   do this movement. Keep your shoulder blade tucked down toward the middle of your back. 5. Hold for __________ seconds. 6. Slowly lower your arm, and return to the starting position. Repeat __________ times. Complete this exercise __________ times a day. Shoulder extension 1. Sit in a stable chair without armrests, or stand up. 2. Secure an exercise band to a stable object in front of you so it is at shoulder height. 3. Hold one end of the exercise band in each hand. Your palms should face each other. 4. Straighten your elbows and lift your hands up to shoulder height. 5. Step back, away from the secured end of the exercise band, until the band is tight and there is no slack. 6. Squeeze your shoulder blades together as you pull your hands down to the sides of your thighs (extension). Stop when your hands are straight down by your sides. Do not let your hands go behind your body. 7. Hold for __________ seconds. 8. Slowly return to the starting position. Repeat __________ times.  Complete this exercise __________ times a day. Shoulder row 1. Sit in a stable chair without armrests, or stand up. 2. Secure an exercise band to a stable object in front of you so it is at waist height. 3. Hold one end of the exercise band in each hand. Position your palms so that your thumbs are facing the ceiling (neutral position). 4. Bend each of your elbows to a 90-degree angle (right angle) and keep your upper arms at your sides. 5. Step back until the band is tight and there is no slack. 6. Slowly pull your elbows back behind you. 7. Hold for __________ seconds. 8. Slowly return to the starting position. Repeat __________ times. Complete this exercise __________ times a day. Shoulder press-ups  1. Sit in a stable chair that has armrests. Sit upright, with your feet flat on the floor. 2. Put your hands on the armrests so your elbows are bent and your fingers are pointing forward. Your hands should be about even with the sides of your body. 3. Push down on the armrests and use your arms to lift yourself off the chair. Straighten your elbows and lift yourself up as much as you comfortably can. ? Move your shoulder blades down, and avoid letting your shoulders move up toward your ears. ? Keep your feet on the ground. As you get stronger, your feet should support less of your body weight as you lift yourself up. 4. Hold for __________ seconds. 5. Slowly lower yourself back into the chair. Repeat __________ times. Complete this exercise __________ times a day. Wall push-ups  1. Stand so you are facing a stable wall. Your feet should be about one arm-length away from the wall. 2. Lean forward and place your palms on the wall at shoulder height. 3. Keep your feet flat on the floor as you bend your elbows and lean forward toward the wall. 4. Hold for __________ seconds. 5. Straighten your elbows to push yourself back to the starting position. Repeat __________ times. Complete this exercise  __________ times a day. This information is not intended to replace advice given to you by your health care provider. Make sure you discuss any questions you have with your health care provider. Document Revised: 08/07/2018 Document Reviewed: 05/15/2018 Elsevier Patient Education  2020 [employer] Inc[employer]  

## 2019-11-11 NOTE — Progress Notes (Signed)
I,Joseline E Rosas,acting as a scribe for [employer], PA-C.,have documented all relevant documentation on the behalf of Margaretann Loveless, PA-C,as directed by  Margaretann Loveless, PA-C while in the presence of Margaretann Loveless, [place].  Established patient visit   Patient: Patrick Lucas   DOB: [birth year]   49 y.o. Male  MRN: [id number] Visit Date: 11/11/2019  Today's healthcare provider: Margaretann Loveless, PA-C   Chief Complaint  Patient presents with   Shoulder Pain   Subjective    Shoulder Pain  The pain is present in the right shoulder (radiates to his bicep). This is a new problem. The current episode started 1 to 4 weeks ago (couple of weeks). History of extremity trauma: He fell a couple of weeks ago and hit his right forearm. The problem occurs constantly. The problem has been gradually worsening. The quality of the pain is described as aching and sharp. The pain is at a severity of 6/10. The pain is moderate. Associated symptoms include a limited range of motion ("some"). Pertinent negatives include no inability to bear weight, joint locking, joint swelling, numbness or tingling. The symptoms are aggravated by activity and lying down. He has tried heat and acetaminophen for the symptoms. The treatment provided no relief.     Patient Active Problem List   Diagnosis Date Noted   Tobacco use disorder 10/29/2016   Alcohol use disorder, severe, in sustained remission (HCC) 10/29/2016   MDD (major depressive disorder), single episode, severe (HCC) 10/29/2016   HTN (hypertension) 10/29/2016   Hip pain, chronic 10/14/2014   Past Medical History:  Diagnosis Date   Allergic rhinitis    Anxiety    Fracture of multiple ribs, right, closed. initial encounter 10/14/2014   Fracture of one rib of right side, sequela 10/14/2014   Insomnia    Sciatica 10/14/2014       Medications: Outpatient Medications Prior to Visit  Medication Sig   [DISCONTINUED]  ALPRAZolam (XANAX) 0.5 MG tablet TAKE 1/2 TO 1 TABLET BY MOUTH EVERY 4 HOURS AS NEEDED (Patient not taking: Reported on 11/11/2019)   [DISCONTINUED] atenolol (TENORMIN) 25 MG tablet Take 0.5 tablets (12.5 mg total) by mouth daily. (Patient not taking: Reported on 11/11/2019)   [DISCONTINUED] FLUoxetine (PROZAC) 20 MG capsule Take 1 capsule (20 mg total) by mouth daily. (Patient not taking: Reported on 11/11/2019)   [DISCONTINUED] HYDROcodone-acetaminophen (NORCO/VICODIN) 5-325 MG tablet Take 1 tablet by mouth every 6 (six) hours as needed for moderate pain. (Patient not taking: Reported on 11/11/2019)   [DISCONTINUED] LORazepam (ATIVAN) 1 MG tablet TAKE 1 TO 2 TABLETS BY MOUTH AT BEDTIME AS NEEDED FOR ANXIETY (Patient not taking: Reported on 11/11/2019)   [DISCONTINUED] sulfamethoxazole-trimethoprim (BACTRIM DS) 800-160 MG tablet Take 1 tablet by mouth 2 (two) times daily. (Patient not taking: Reported on 11/11/2019)   [DISCONTINUED] traZODone (DESYREL) 150 MG tablet Take 1 tablet (150 mg total) by mouth at bedtime as needed for sleep. (Patient not taking: Reported on 11/11/2019)   [DISCONTINUED] valACYclovir (VALTREX) 1000 MG tablet Take by mouth. (Patient not taking: Reported on 11/11/2019)   No facility-administered medications prior to visit.    Review of Systems  Constitutional: Negative.   Respiratory: Negative.   Cardiovascular: Negative.   Musculoskeletal: Positive for arthralgias and myalgias.  Neurological: Negative for tingling, weakness and numbness.    Last CBC Lab Results  Component Value Date   WBC 8.6 09/25/2019   HGB 15.0 09/25/2019   HCT 43.8 09/25/2019  MCV 88.1 09/25/2019   MCH 30.2 09/25/2019   RDW 12.4 09/25/2019   PLT 281 09/25/2019   Last metabolic panel Lab Results  Component Value Date   GLUCOSE 109 (H) 09/25/2019   NA 137 09/25/2019   K 3.5 09/25/2019   CL 104 09/25/2019   CO2 23 09/25/2019   BUN 19 09/25/2019   CREATININE 1.09 09/25/2019   GFRNONAA  >60 09/25/2019   GFRAA >60 09/25/2019   CALCIUM 8.9 09/25/2019   PROT 7.2 01/13/2017   ALBUMIN 3.8 01/13/2017   BILITOT 0.6 01/13/2017   ALKPHOS 76 01/13/2017   AST 42 (H) 01/13/2017   ALT 48 01/13/2017   ANIONGAP 10 09/25/2019      Objective    BP (!) 132/92 (BP Location: Left Arm, Patient Position: Sitting, Cuff Size: Large)    Pulse (!) 120    Temp (!) 97.4 F (36.3 C) (Temporal)    Resp 16    Wt 190 lb 12.8 oz (86.5 kg)    BMI 25.88 kg/m  BP Readings from Last 3 Encounters:  11/11/19 (!) 132/92  09/25/19 (!) 159/94  02/25/19 (!) 158/92   Wt Readings from Last 3 Encounters:  11/11/19 190 lb 12.8 oz (86.5 kg)  09/25/19 200 lb (90.7 kg)  02/25/19 205 lb (93 kg)      Physical Exam Vitals reviewed.  Constitutional:      General: He is not in acute distress.    Appearance: Normal appearance. He is well-developed. He is not ill-appearing or diaphoretic.  HENT:     Head: Normocephalic and atraumatic.  Neck:     Thyroid: No thyromegaly.     Vascular: No JVD.     Trachea: No tracheal deviation.  Cardiovascular:     Rate and Rhythm: Normal rate and regular rhythm.     Heart sounds: Normal heart sounds. No murmur heard.  No friction rub. No gallop.   Pulmonary:     Effort: Pulmonary effort is normal. No respiratory distress.     Breath sounds: Normal breath sounds. No wheezing or rales.  Musculoskeletal:     Right shoulder: Deformity (slightly more prominent AC joint than left) and tenderness present. No swelling, effusion, laceration, bony tenderness or crepitus. Decreased range of motion (decreased in abd, horiz abd, shoulder forward flexion, ER, ER at 90 deg abd, IR at 90 deg abd). Normal strength. Normal pulse.     Cervical back: Normal range of motion and neck supple. No tenderness. No pain with movement, spinous process tenderness or muscular tenderness. Normal range of motion.     Comments: Negative Hawkins Impingement Positive Empty can for pain; strength  intact Negative drop arm  Lymphadenopathy:     Cervical: No cervical adenopathy.  Neurological:     Mental Status: He is alert.       No results found for any visits on 11/11/19.  Assessment & Plan     1. Acute pain of right shoulder Will get imaging as below to r/o bony abnormality. Will use Medrol dose pak as below to calm down inflammation. Once completed may start using meloxicam as below. May apply moist heat. Ice after work. Return in 2 weeks for re-evaluation. If improving may cancel appointment. If no improvement may consider steroid injection and PT vs referral to orthopedics.  - DG Shoulder Right; Future - methylPREDNISolone (MEDROL) 4 MG TBPK tablet; 6 day taper take as directed on package directions  Dispense: 21 tablet; Refill: 0 - meloxicam (MOBIC) 15 MG tablet; Take  1 tablet (15 mg total) by mouth daily. Start once medrol completed  Dispense: 30 tablet; Refill: 0   No follow-ups on file.      Patrick Islam, PA-C, have reviewed all documentation for this visit. The documentation on 11/11/19 for the exam, diagnosis, procedures, and orders are all accurate and complete.   Patrick Lucas  [facility] [phone] (phone) [phone] (fax)  [facility]

## 2019-11-15 ENCOUNTER — Telehealth: Payer: Self-pay

## 2019-11-15 NOTE — Telephone Encounter (Signed)
-----   Message from Margaretann Loveless, [place] sent at 11/15/2019 10:28 AM EDT ----- There is mild arthritic changes noted in the right shoulder, but no fractures or other bony abnormalities.

## 2019-11-15 NOTE — Telephone Encounter (Signed)
Pt advised.   Thanks,   -Sheniya Garciaperez  

## 2019-11-25 ENCOUNTER — Ambulatory Visit: Payer: Self-pay | Admitting: Physician Assistant

## 2020-01-07 ENCOUNTER — Telehealth: Payer: Self-pay

## 2020-01-07 NOTE — Telephone Encounter (Signed)
Contact patient back and he states that he just left test site and wast tested for covid. KW  Copied from CRM [id number]. Topic: Appointment Scheduling - Scheduling Inquiry for Clinic >> Jan 07, 2020 12:31 PM Patrick Lucas wrote: Reason for CRM: Pt would like to get a covid test/advised pt of a community location but if that is not available he would like information about testing at the office/ Pt states he has had headaches and feels of hot flashes / next available appt time was for Tuesday of next week but pt wanted a test asap /please advise

## 2020-01-11 ENCOUNTER — Encounter: Payer: Self-pay | Admitting: Family Medicine

## 2020-01-11 ENCOUNTER — Other Ambulatory Visit: Payer: Self-pay

## 2020-01-11 ENCOUNTER — Ambulatory Visit (INDEPENDENT_AMBULATORY_CARE_PROVIDER_SITE_OTHER): Payer: BC Managed Care – PPO | Admitting: Family Medicine

## 2020-01-11 DIAGNOSIS — M25511 Pain in right shoulder: Secondary | ICD-10-CM | POA: Diagnosis not present

## 2020-01-11 DIAGNOSIS — F419 Anxiety disorder, unspecified: Secondary | ICD-10-CM

## 2020-01-11 DIAGNOSIS — G4700 Insomnia, unspecified: Secondary | ICD-10-CM | POA: Diagnosis not present

## 2020-01-11 MED ORDER — PREDNISONE 10 MG PO TABS: ORAL_TABLET | ORAL | 0 refills | Status: AC

## 2020-01-11 MED ORDER — LORAZEPAM 1 MG PO TABS: ORAL_TABLET | ORAL | 1 refills | Status: DC

## 2020-01-11 MED ORDER — ALPRAZOLAM 0.5 MG PO TABS: ORAL_TABLET | ORAL | 3 refills | Status: DC

## 2020-01-11 NOTE — Progress Notes (Signed)
MyChart Video Visit    Virtual Visit via Video Note   This visit type was conducted due to national recommendations for restrictions regarding the COVID-19 Pandemic (e.g. social distancing) in an effort to limit this patient's exposure and mitigate transmission in our community. This patient is at least at moderate risk for complications without adequate follow up. This format is felt to be most appropriate for this patient at this time. Physical exam was limited by quality of the video and audio technology used for the visit.   Patient location: home Provider location: [facility]  I discussed the limitations of evaluation and management by telemedicine and the availability of in person appointments. The patient expressed understanding and agreed to proceed.  Patient: Patrick Lucas   DOB: 1970-08-16   49 y.o. Male  MRN: [id number] Visit Date: 01/11/2020  Today's healthcare provider: Mila Merry, MD   Chief Complaint  Patient presents with  . Insomnia  . Anxiety   Subjective    HPI  Anxiety and Insomnia,  Patient complains of having problems with insomnia and anxiety. He has trouble staying asleep and falling asleep. He is interested in starting back on Lorazepam and Xanax. He states they worked well for him in the past.   Symptoms: No chest pain Yes difficulty concentrating  No dizziness Yes fatigue  No feelings of losing control Yes insomnia  Yes irritable No palpitations  No panic attacks Yes racing thoughts  No shortness of breath No sweating  No tremors/shakes    GAD-7 Results GAD-7 Generalized Anxiety Disorder Screening Tool 01/11/2020  1. Feeling Nervous, Anxious, or on Edge 1  2. Not Being Able to Stop or Control Worrying 1  3. Worrying Too Much About Different Things 3  4. Trouble Relaxing 2  5. Being So Restless it's Hard To Sit Still 0  6. Becoming Easily Annoyed or Irritable 3  7. Feeling Afraid As If Something Awful Might Happen 0  Total GAD-7 Score 10    Difficulty At Work, Home, or Getting  Along With Others? Somewhat difficult    PHQ-9 Scores PHQ9 SCORE ONLY 01/11/2020 03/30/2015  PHQ-9 Total Score 7 12    --------------------------------------------------------------------------------------------------- He was seen for shoulder injury in July and prescribed medrol for six days and meloxicam. He states it is still sore and significantly limiting his activity and ability to lift heavy objects, and sometimes keeps him up at night.     Medications: Outpatient Medications Prior to Visit  Medication Sig  . meloxicam (MOBIC) 15 MG tablet Take 1 tablet (15 mg total) by mouth daily. Start once medrol completed (Patient not taking: Reported on 01/11/2020)  . [DISCONTINUED] methylPREDNISolone (MEDROL) 4 MG TBPK tablet 6 day taper take as directed on package directions (Patient not taking: Reported on 01/11/2020)   No facility-administered medications prior to visit.    Review of Systems  Constitutional: Negative for appetite change, chills and fever.  Respiratory: Negative for chest tightness, shortness of breath and wheezing.   Cardiovascular: Negative for chest pain and palpitations.  Gastrointestinal: Negative for abdominal pain, nausea and vomiting.  Psychiatric/Behavioral: Positive for agitation, decreased concentration and sleep disturbance. The patient is nervous/anxious.      Objective    There were no vitals taken for this visit.   Physical Exam   Awake, alert, oriented x 3. In no apparent distress    Assessment & Plan     1. Insomnia, unspecified type restart- LORazepam (ATIVAN) 1 MG tablet; TAKE 1 TO 2  TABLETS BY MOUTH AT BEDTIME AS NEEDED FOR ANXIETY  Dispense: 90 tablet; Refill: 1  2. Anxiety  - ALPRAZolam (XANAX) 0.5 MG tablet; TAKE 1/2 TO 1 TABLET BY MOUTH EVERY 4 HOURS AS NEEDED  Dispense: 30 tablet; Refill: 3  3. Acute pain of right shoulder Minimal improvement since original injury in July. He does feel like  he had some improvement with oral steroid, but meloxicam not helping at all.  - predniSONE (DELTASONE) 10 MG tablet; 6 tablets for 2 days, then 5 for 2 days, then 4 for 2 days, then 3 for 2 days, then 2 for 2 days, then 1 for 2 days.  Dispense: 42 tablet; Refill: 0   Advised to call back for orthopedic referral if not not much better when finished with prednisone taper      I discussed the assessment and treatment plan with the patient. The patient was provided an opportunity to ask questions and all were answered. The patient agreed with the plan and demonstrated an understanding of the instructions.   The patient was advised to call back or seek an in-person evaluation if the symptoms worsen or if the condition fails to improve as anticipated.  I provided 12 minutes of non-face-to-face time during this encounter.  The entirety of the information documented in the History of Present Illness, Review of Systems and Physical Exam were personally obtained by me. Portions of this information were initially documented by the CMA and reviewed by me for thoroughness and accuracy.     Mila Merry, MD [facility] [phone] (phone) [phone] (fax)  [facility]

## 2020-01-17 NOTE — Addendum Note (Signed)
Addended by: Mila Merry E on: 01/17/2020 11:13 AM   Modules accepted: Level of Service

## 2020-05-10 DIAGNOSIS — Z03818 Encounter for observation for suspected exposure to other biological agents ruled out: Secondary | ICD-10-CM | POA: Diagnosis not present

## 2020-05-10 DIAGNOSIS — Z20822 Contact with and (suspected) exposure to covid-19: Secondary | ICD-10-CM | POA: Diagnosis not present

## 2020-06-15 ENCOUNTER — Other Ambulatory Visit: Payer: Self-pay

## 2020-06-15 ENCOUNTER — Encounter: Payer: Self-pay | Admitting: Adult Health

## 2020-06-15 ENCOUNTER — Ambulatory Visit (INDEPENDENT_AMBULATORY_CARE_PROVIDER_SITE_OTHER): Payer: BC Managed Care – PPO | Admitting: Adult Health

## 2020-06-15 VITALS — BP 130/86 | HR 111 | Temp 98.0°F | Resp 16 | Wt 192.8 lb

## 2020-06-15 DIAGNOSIS — G8929 Other chronic pain: Secondary | ICD-10-CM | POA: Diagnosis not present

## 2020-06-15 DIAGNOSIS — T1490XA Injury, unspecified, initial encounter: Secondary | ICD-10-CM | POA: Diagnosis not present

## 2020-06-15 DIAGNOSIS — M25511 Pain in right shoulder: Secondary | ICD-10-CM

## 2020-06-15 MED ORDER — PREDNISONE 10 MG (21) PO TBPK
ORAL_TABLET | ORAL | 0 refills | Status: DC
Start: 2020-06-15 — End: 2023-03-13

## 2020-06-15 NOTE — Progress Notes (Signed)
Established patient visit   Patient: Patrick Lucas   DOB: 07-11-1970   49 y.o. Male  MRN: [id number] Visit Date: 06/15/2020  Today's healthcare provider: Jairo Ben, FNP   Chief Complaint  Patient presents with  . Shoulder Pain   Subjective    Shoulder Pain  The pain is present in the right shoulder. This is a chronic problem. The current episode started more than 1 month ago. There has been a history of trauma. The problem occurs constantly. The problem has been gradually worsening. The quality of the pain is described as sharp. Associated symptoms include an inability to bear weight and a limited range of motion. Pertinent negatives include no fever, itching, joint locking, joint swelling, numbness, stiffness or tingling. Treatments tried: Medrol and Meloxicam. The treatment provided no relief. There is no history of osteoarthritis.    Was seen by Patrick Lucas on 11/11/2019 for right shoulder pain radiating to his bicep that had been occurring for 1- 4 weeks. He had fell hitting right forearm at that time. He was prescribed Mobic and a medrol dose pack and has had no response he reports.   X-ray was reviewed : IMPRESSION: Minor degenerative changes. No acute osseous finding by plain radiography Electronically Signed   By: Patrick Petit.  Lucas M.D.   On: 11/12/2019 15:44   He can not sleep on his right side, pain is still in right shoulder radiating to bicep anterior. 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 Denies any pain in cervical spine. Denies any new injury since then.  Meloxicam did not help at all.   He has been taking Ibuprofen PRN.  Pain in shoulder with lifting.   Patient  denies any fever, body aches,chills, rash, chest pain, shortness of breath, nausea, vomiting, or diarrhea.  Denies dizziness, lightheadedness, pre syncopal or syncopal episodes.     Patient Active Problem List   Diagnosis  Date Noted  . Injury 06/15/2020  . Chronic right shoulder pain 06/15/2020  . Tobacco use disorder 10/29/2016  . Alcohol use disorder, severe, in sustained remission (HCC) 10/29/2016  . MDD (major depressive disorder), single episode, severe (HCC) 10/29/2016  . HTN (hypertension) 10/29/2016  . Hip pain, chronic 10/14/2014   Past Medical History:  Diagnosis Date  . Allergic rhinitis   . Anxiety   . Fracture of multiple ribs, right, closed. initial encounter 10/14/2014  . Fracture of one rib of right side, sequela 10/14/2014  . Insomnia   . Sciatica 10/14/2014       Medications: Outpatient Medications Prior to Visit  Medication Sig Note  . ALPRAZolam (XANAX) 0.5 MG tablet TAKE 1/2 TO 1 TABLET BY MOUTH EVERY 4 HOURS AS NEEDED   . LORazepam (ATIVAN) 1 MG tablet TAKE 1 TO 2 TABLETS BY MOUTH AT BEDTIME AS NEEDED FOR ANXIETY   . valACYclovir (VALTREX) 1000 MG tablet Take by mouth. (Patient not taking: Reported on 06/15/2020) 06/15/2020: PRN  . [DISCONTINUED] meloxicam (MOBIC) 15 MG tablet Take 1 tablet (15 mg total) by mouth daily. Start once medrol completed (Patient not taking: Reported on 01/11/2020)    No facility-administered medications prior to visit.    Review of Systems  Constitutional: Negative for fever.  Musculoskeletal: Negative for stiffness.  Skin: Negative for itching.  Neurological: Negative for tingling and numbness.    Last CBC Lab Results  Component Value Date   WBC 8.6 09/25/2019   HGB  15.0 09/25/2019   HCT 43.8 09/25/2019   MCV 88.1 09/25/2019   MCH 30.2 09/25/2019   RDW 12.4 09/25/2019   PLT 281 09/25/2019   Last metabolic panel Lab Results  Component Value Date   GLUCOSE 109 (H) 09/25/2019   NA 137 09/25/2019   K 3.5 09/25/2019   CL 104 09/25/2019   CO2 23 09/25/2019   BUN 19 09/25/2019   CREATININE 1.09 09/25/2019   GFRNONAA >60 09/25/2019   GFRAA >60 09/25/2019   CALCIUM 8.9 09/25/2019   PROT 7.2 01/13/2017   ALBUMIN 3.8 01/13/2017   BILITOT  0.6 01/13/2017   ALKPHOS 76 01/13/2017   AST 42 (H) 01/13/2017   ALT 48 01/13/2017   ANIONGAP 10 09/25/2019       Objective    BP 130/86   Pulse (!) 111   Temp 98 F (36.7 C) (Oral)   Resp 16   Wt 192 lb 12.8 oz (87.5 kg)   SpO2 100%   BMI 26.15 kg/m  BP Readings from Last 3 Encounters:  06/15/20 130/86  11/11/19 (!) 132/92  09/25/19 (!) 159/94   Wt Readings from Last 3 Encounters:  06/15/20 192 lb 12.8 oz (87.5 kg)  11/11/19 190 lb 12.8 oz (86.5 kg)  09/25/19 200 lb (90.7 kg)       Physical Exam Vitals reviewed.  Constitutional:      General: He is not in acute distress.    Appearance: Normal appearance. He is not ill-appearing, toxic-appearing or diaphoretic.  HENT:     Head: Normocephalic and atraumatic.     Right Ear: There is no impacted cerumen.     Left Ear: There is no impacted cerumen.     Nose: Nose normal.     Mouth/Throat:     Mouth: Mucous membranes are moist.  Eyes:     Pupils: Pupils are equal, round, and reactive to light.  Cardiovascular:     Rate and Rhythm: Normal rate.     Pulses: Normal pulses.     Heart sounds: Normal heart sounds. No murmur heard. No friction rub. No gallop.   Pulmonary:     Effort: Pulmonary effort is normal.     Breath sounds: Normal breath sounds.  Abdominal:     Palpations: Abdomen is soft.  Musculoskeletal:        General: Tenderness present.     Cervical back: Neck supple.  Skin:    General: Skin is warm.     Findings: No rash.  Neurological:     Mental Status: He is alert and oriented to person, place, and time.     Gait: Gait normal.     Deep Tendon Reflexes: Reflexes normal.  Psychiatric:        Mood and Affect: Mood normal.        Behavior: Behavior normal.        Thought Content: Thought content normal.        Judgment: Judgment normal.      No results found for any visits on 06/15/20.  Assessment & Plan     1. Chronic right shoulder pain Pain in shoulder since around 7/22/ 22, no injury  post last x ray will refer to orthopedics, Consider MRI.  - Ambulatory referral to Orthopedic Surgery Meds ordered this encounter  Medications  . predniSONE (STERAPRED UNI-PAK 21 TAB) 10 MG (21) TBPK tablet    Sig: PO: Take 6 tablets on day 1:Take 5 tablets day 2:Take 4 tablets day 3: Take 3  tablets day 4:Take 2 tablets day five: 5 Take 1 tablet day 6    Dispense:  21 tablet    Refill:  0   2. Injury He reports the above occurred at work he did not report it at the time. Advised to talk with his work.   Orders Placed This Encounter  Procedures  . Ambulatory referral to Orthopedic Surgery  Call if not heard within 2 weeks from [facility].    Red Flags discussed. The patient was given clear instructions to go to ER or return to medical center if any red flags develop, symptoms do not improve, worsen or new problems develop. They verbalized understanding.  Return if symptoms worsen or fail to improve, for at any time for any worsening symptoms, Go to Emergency room/ urgent care if worse.     The entirety of the information documented in the History of Present Illness, Review of Systems and Physical Exam were personally obtained by me. Portions of this information were initially documented by the CMA and reviewed by me for thoroughness and accuracy.      Patrick Ben, FNP  [facility] [phone] (phone) [phone] (fax)  [facility]

## 2020-06-15 NOTE — Patient Instructions (Signed)
Prednisone tablets What is this medicine? PREDNISONE (PRED ni sone) is a corticosteroid. It is commonly used to treat inflammation of the skin, joints, lungs, and other organs. Common conditions treated include asthma, allergies, and arthritis. It is also used for other conditions, such as blood disorders and diseases of the adrenal glands. This medicine may be used for other purposes; ask your health care provider or pharmacist if you have questions. COMMON BRAND NAME(S): Deltasone, Predone, Sterapred, Sterapred DS What should I tell my health care provider before I take this medicine? They need to know if you have any of these conditions:  Cushing's syndrome  diabetes  glaucoma  heart disease  high blood pressure  infection (especially a virus infection such as chickenpox, cold sores, or herpes)  kidney disease  liver disease  mental illness  myasthenia gravis  osteoporosis  seizures  stomach or intestine problems  thyroid disease  an unusual or allergic reaction to lactose, prednisone, other medicines, foods, dyes, or preservatives  pregnant or trying to get pregnant  breast-feeding How should I use this medicine? Take this medicine by mouth with a glass of water. Follow the directions on the prescription label. Take this medicine with food. If you are taking this medicine once a day, take it in the morning. Do not take more medicine than you are told to take. Do not suddenly stop taking your medicine because you may develop a severe reaction. Your doctor will tell you how much medicine to take. If your doctor wants you to stop the medicine, the dose may be slowly lowered over time to avoid any side effects. Talk to your pediatrician regarding the use of this medicine in children. Special care may be needed. Overdosage: If you think you have taken too much of this medicine contact a poison control center or emergency room at once. NOTE: This medicine is only for you.  Do not share this medicine with others. What if I miss a dose? If you miss a dose, take it as soon as you can. If it is almost time for your next dose, talk to your doctor or health care professional. You may need to miss a dose or take an extra dose. Do not take double or extra doses without advice. What may interact with this medicine? Do not take this medicine with any of the following medications:  metyrapone  mifepristone This medicine may also interact with the following medications:  aminoglutethimide  amphotericin B  aspirin and aspirin-like medicines  barbiturates  certain medicines for diabetes, like glipizide or glyburide  cholestyramine  cholinesterase inhibitors  cyclosporine  digoxin  diuretics  ephedrine  male hormones, like estrogens and birth control pills  isoniazid  ketoconazole  NSAIDS, medicines for pain and inflammation, like ibuprofen or naproxen  phenytoin  rifampin  toxoids  vaccines  warfarin This list may not describe all possible interactions. Give your health care provider a list of all the medicines, herbs, non-prescription drugs, or dietary supplements you use. Also tell them if you smoke, drink alcohol, or use illegal drugs. Some items may interact with your medicine. What should I watch for while using this medicine? Visit your doctor or health care professional for regular checks on your progress. If you are taking this medicine over a prolonged period, carry an identification card with your name and address, the type and dose of your medicine, and your doctor's name and address. This medicine may increase your risk of getting an infection. Tell your doctor or   health care professional if you are around anyone with measles or chickenpox, or if you develop sores or blisters that do not heal properly. If you are going to have surgery, tell your doctor or health care professional that you have taken this medicine within the last  twelve months. Ask your doctor or health care professional about your diet. You may need to lower the amount of salt you eat. This medicine may increase blood sugar. Ask your healthcare provider if changes in diet or medicines are needed if you have diabetes. What side effects may I notice from receiving this medicine? Side effects that you should report to your doctor or health care professional as soon as possible:  allergic reactions like skin rash, itching or hives, swelling of the face, lips, or tongue  changes in emotions or moods  changes in vision  depressed mood  eye pain  fever or chills, cough, sore throat, pain or difficulty passing urine  signs and symptoms of high blood sugar such as being more thirsty or hungry or having to urinate more than normal. You may also feel very tired or have blurry vision.  swelling of ankles, feet Side effects that usually do not require medical attention (report to your doctor or health care professional if they continue or are bothersome):  confusion, excitement, restlessness  headache  nausea, vomiting  skin problems, acne, thin and shiny skin  trouble sleeping  weight gain This list may not describe all possible side effects. Call your doctor for medical advice about side effects. You may report side effects to FDA at 1-800-FDA-1088. Where should I keep my medicine? Keep out of the reach of children. Store at room temperature between 15 and 30 degrees C (59 and 86 degrees F). Protect from light. Keep container tightly closed. Throw away any unused medicine after the expiration date. NOTE: This sheet is a summary. It may not cover all possible information. If you have questions about this medicine, talk to your doctor, pharmacist, or health care provider.  2021 Elsevier/Gold Standard (2018-01-13 10:54:22) Shoulder Pain Many things can cause shoulder pain, including:  An injury.  Moving the shoulder in the same way again and  again (overuse).  Joint pain (arthritis). Pain can come from:  Swelling and irritation (inflammation) of any part of the shoulder.  An injury to the shoulder joint.  An injury to: ? Tissues that connect muscle to bone (tendons). ? Tissues that connect bones to each other (ligaments). ? Bones. Follow these instructions at home: Watch for changes in your symptoms. Let your doctor know about them. Follow these instructions to help with your pain. If you have a sling:  Wear the sling as told by your doctor. Remove it only as told by your doctor.  Loosen the sling if your fingers: ? Tingle. ? Become numb. ? Turn cold and blue.  Keep the sling clean.  If the sling is not waterproof: ? Do not let it get wet. ? Take the sling off when you shower or bathe. Managing pain, stiffness, and swelling  If told, put ice on the painful area: ? Put ice in a plastic bag. ? Place a towel between your skin and the bag. ? Leave the ice on for 20 minutes, 2-3 times a day. Stop putting ice on if it does not help with the pain.  Squeeze a soft ball or a foam pad as much as possible. This prevents swelling in the shoulder. It also helps to strengthen the  arm.   General instructions  Take over-the-counter and prescription medicines only as told by your doctor.  Keep all follow-up visits as told by your doctor. This is important. Contact a doctor if:  Your pain gets worse.  Medicine does not help your pain.  You have new pain in your arm, hand, or fingers. Get help right away if:  Your arm, hand, or fingers: ? Tingle. ? Are numb. ? Are swollen. ? Are painful. ? Turn white or blue. Summary  Shoulder pain can be caused by many things. These include injury, moving the shoulder in the same away again and again, and joint pain.  Watch for changes in your symptoms. Let your doctor know about them.  This condition may be treated with a sling, ice, and pain medicine.  Contact your doctor  if the pain gets worse or you have new pain. Get help right away if your arm, hand, or fingers tingle or get numb, swollen, or painful.  Keep all follow-up visits as told by your doctor. This is important. This information is not intended to replace advice given to you by your health care provider. Make sure you discuss any questions you have with your health care provider. Document Revised: 10/28/2017 Document Reviewed: 10/28/2017 Elsevier Patient Education  2021 [employer].

## 2020-06-28 ENCOUNTER — Other Ambulatory Visit: Payer: Self-pay | Admitting: Family Medicine

## 2020-06-28 DIAGNOSIS — F419 Anxiety disorder, unspecified: Secondary | ICD-10-CM

## 2020-06-28 NOTE — Telephone Encounter (Signed)
Requested medication (s) are due for refill today: yes  Requested medication (s) are on the active medication list: yes  Last refill:  01/11/20 #30 3 refills  Future visit scheduled: no  Notes to clinic:  not delegated per protocol     Requested Prescriptions  Pending Prescriptions Disp Refills   ALPRAZolam (XANAX) 0.5 MG tablet [Pharmacy Med Name: ALPRAZOLAM 0.5MG  TABLETS] 30 tablet     Sig: TAKE ONE HALF TO 1 TABLET BY MOUTH EVERY 4 HOURS AS NEEDED      Not Delegated - Psychiatry:  Anxiolytics/Hypnotics Failed - 06/28/2020  3:43 PM      Failed - This refill cannot be delegated      Failed - Urine Drug Screen completed in last 360 days      Passed - Valid encounter within last 6 months    Recent Outpatient Visits           1 week ago Chronic right shoulder pain   [facility] Flinchum, Eula Fried, FNP   5 months ago Insomnia, unspecified type   [facility] Malva Limes, MD   7 months ago Acute pain of right shoulder   [facility] [place], Alessandra Bevels, [place]   2 years ago Nasal pain   [facility] Malva Limes, MD   3 years ago Chills   [facility] Sherrie Mustache, Demetrios Isaacs, MD

## 2022-09-10 DIAGNOSIS — M1990 Unspecified osteoarthritis, unspecified site: Secondary | ICD-10-CM | POA: Diagnosis not present

## 2022-09-10 DIAGNOSIS — Z299 Encounter for prophylactic measures, unspecified: Secondary | ICD-10-CM | POA: Diagnosis not present

## 2022-09-10 DIAGNOSIS — J302 Other seasonal allergic rhinitis: Secondary | ICD-10-CM | POA: Diagnosis not present

## 2022-09-10 DIAGNOSIS — Z8719 Personal history of other diseases of the digestive system: Secondary | ICD-10-CM | POA: Diagnosis not present

## 2022-09-10 DIAGNOSIS — G4700 Insomnia, unspecified: Secondary | ICD-10-CM | POA: Diagnosis not present

## 2023-03-13 ENCOUNTER — Ambulatory Visit (INDEPENDENT_AMBULATORY_CARE_PROVIDER_SITE_OTHER): Payer: BC Managed Care – PPO | Admitting: Family Medicine

## 2023-03-13 ENCOUNTER — Encounter: Payer: Self-pay | Admitting: Family Medicine

## 2023-03-13 VITALS — BP 121/84 | HR 96 | Ht 72.0 in | Wt 224.1 lb

## 2023-03-13 DIAGNOSIS — G4700 Insomnia, unspecified: Secondary | ICD-10-CM | POA: Diagnosis not present

## 2023-03-13 MED ORDER — LORAZEPAM 1 MG PO TABS: 1.0000 mg | ORAL_TABLET | Freq: Every evening | ORAL | 0 refills | Status: DC | PRN

## 2023-03-13 NOTE — Progress Notes (Signed)
Established Patient Office Visit  Subjective   Patient ID: Patrick Lucas, male    DOB: 1971-04-20  Age: 52 y.o. MRN: [id number]  Chief Complaint  Patient presents with   Insomnia    Patient present in regards to medication. Last seen for 01/11/20. Patient would like to see if he could go back on his xanax and loreazapam rx. Patient reports he was released from prison last week and trying to get back into the swing of things. Reports he has always had issues with sleeping and was dealing with anxiety due to going through divorce last time he was given rx.    Immunizations    Declined influenza, shingles as well as tetanus vaccine    HPI  Discussed the use of AI scribe software for clinical note transcription with the patient, who gave verbal consent to proceed.  History of Present Illness   Patrick Lucas, a patient with a history of anxiety and insomnia, presents to the clinic seeking to restart previous medications, Xanax and lorazepam, which were prescribed by Dr. Sherrie Mustache. The Xanax was used as needed for anxiety, and lorazepam was used nightly for sleep. The patient has been off these medications for a couple of years due to incarceration. Since release from prison last week, the patient reports increased anxiety and difficulty adjusting to life outside of prison. The patient also reports intermittent depressive symptoms, which they attribute to the transition. Prior to starting Xanax and lorazepam, the patient had tried trazodone for sleep and Prozac and Lexapro for anxiety. The patient reports that trazodone was used in combination with lorazepam for a period due to difficulty both falling asleep and staying asleep.         ROS    Objective:     BP 121/84 (BP Location: Left Arm, Patient Position: Sitting, Cuff Size: Normal)   Pulse 96   Ht 6' (1.829 m)   Wt 224 lb 1.6 oz (101.7 kg)   SpO2 97%   BMI 30.39 kg/m    Physical Exam Vitals reviewed.  Constitutional:      General: He  is not in acute distress.    Appearance: Normal appearance. He is not diaphoretic.  HENT:     Head: Normocephalic and atraumatic.  Eyes:     General: No scleral icterus.    Conjunctiva/sclera: Conjunctivae normal.  Cardiovascular:     Rate and Rhythm: Normal rate and regular rhythm.     Heart sounds: Normal heart sounds. No murmur heard. Pulmonary:     Effort: Pulmonary effort is normal. No respiratory distress.     Breath sounds: Normal breath sounds. No wheezing or rhonchi.  Musculoskeletal:     Cervical back: Neck supple.     Right lower leg: No edema.     Left lower leg: No edema.  Lymphadenopathy:     Cervical: No cervical adenopathy.  Skin:    General: Skin is warm and dry.     Findings: No rash.  Neurological:     Mental Status: He is alert. Mental status is at baseline.  Psychiatric:        Mood and Affect: Mood is anxious.        Behavior: Behavior normal.      No results found for any visits on 03/13/23.    The ASCVD Risk score (Arnett DK, et al., 2019) failed to calculate for the following reasons:   Cannot find a previous HDL lab   Cannot find a previous total cholesterol lab  Assessment & Plan:   Problem List Items Addressed This Visit   None Visit Diagnoses     Insomnia, unspecified type    -  Primary   Relevant Medications   LORazepam (ATIVAN) 1 MG tablet           Anxiety Significant anxiety post-prison release, previously managed with alprazolam. Discussed risks of benzodiazepines, including habit formation, confusion, and falls. Recommended lorazepam for short-term use until follow-up with Dr. Sherrie Mustache. - Refer to Dr. Sherrie Mustache for long-term management plan - Prescribe lorazepam for one month for sleep  Insomnia Chronic insomnia, previously managed with lorazepam and trazodone. Reports waking every two hours. Discussed benzodiazepine risks and habit formation. Will avoid using 2 benzos concomitantly.  Recommended lorazepam for short-term use  until follow-up with Dr. Sherrie Mustache. - Prescribe lorazepam for one month for sleep - Refer to Dr. Sherrie Mustache for long-term management plan  Follow-up - Schedule follow-up with Dr. Sherrie Mustache in one month.        Return in about 4 weeks (around 04/10/2023) for chronic disease f/u, With PCP.    Shirlee Latch, MD

## 2023-03-20 ENCOUNTER — Encounter: Payer: Self-pay | Admitting: Family Medicine

## 2023-03-20 ENCOUNTER — Ambulatory Visit (INDEPENDENT_AMBULATORY_CARE_PROVIDER_SITE_OTHER): Payer: BC Managed Care – PPO | Admitting: Family Medicine

## 2023-03-20 VITALS — BP 131/88 | HR 105 | Temp 97.8°F | Ht 72.0 in | Wt 227.0 lb

## 2023-03-20 DIAGNOSIS — R1084 Generalized abdominal pain: Secondary | ICD-10-CM | POA: Diagnosis not present

## 2023-03-20 DIAGNOSIS — Z8719 Personal history of other diseases of the digestive system: Secondary | ICD-10-CM | POA: Diagnosis not present

## 2023-03-20 MED ORDER — METRONIDAZOLE 500 MG PO TABS: 500.0000 mg | ORAL_TABLET | Freq: Three times a day (TID) | ORAL | 0 refills | Status: AC

## 2023-03-20 MED ORDER — CIPROFLOXACIN HCL 500 MG PO TABS: 500.0000 mg | ORAL_TABLET | Freq: Two times a day (BID) | ORAL | 0 refills | Status: AC

## 2023-03-20 NOTE — Progress Notes (Signed)
Established patient visit   Patient: Patrick Lucas   DOB: 12-Mar-1971   52 y.o. Male  MRN: [id number] Visit Date: 03/20/2023  Today's healthcare provider: Ronnald Ramp, MD   Chief Complaint  Patient presents with   Abdominal Pain    Patient started having abdominal pain yesterday.  He relates having history of diverticulitis and identifies the pain as similar.  He states he hasn't had an episode in a couple years.  He reports that he knows the pain can get worse and since he had trouble sleeping last night due to the pain he wanted to go ahead and get checked.  He has not yet taken any pain medication.  He has pain and nausea but no vomiting or diarrhea and does not feel he has had any fever.   Subjective     HPI     Abdominal Pain    Additional comments: Patient started having abdominal pain yesterday.  He relates having history of diverticulitis and identifies the pain as similar.  He states he hasn't had an episode in a couple years.  He reports that he knows the pain can get worse and since he had trouble sleeping last night due to the pain he wanted to go ahead and get checked.  He has not yet taken any pain medication.  He has pain and nausea but no vomiting or diarrhea and does not feel he has had any fever.      Last edited by Adline Peals, CMA on 03/20/2023  1:59 PM.       Discussed the use of AI scribe software for clinical note transcription with the patient, who gave verbal consent to proceed.  History of Present Illness   The patient, a 52 year old with a history of diverticulitis, presents with abdominal pain that started the previous day. The pain, located in the lower left quadrant, is described as similar to previous episodes of diverticulitis, although he has not had an episode in several years. The pain was significant enough to disrupt his sleep, but has not reached severe levels. Accompanying the pain is some nausea, but no vomiting,  diarrhea, or fever. The patient reports normal bowel movements and urination, with no blood in the stool. He has been eating normally, with the only potential trigger being the consumption of several dill pickles. The patient is currently on a low dose blood pressure medication, which he forgot to take the previous night. He has not started any new medications.         Past Medical History:  Diagnosis Date   Allergic rhinitis    Anxiety    Fracture of multiple ribs, right, closed. initial encounter 10/14/2014   Fracture of one rib of right side, sequela 10/14/2014   Insomnia    Sciatica 10/14/2014    Medications: Outpatient Medications Prior to Visit  Medication Sig   LORazepam (ATIVAN) 1 MG tablet Take 1 tablet (1 mg total) by mouth at bedtime as needed for sleep.   [DISCONTINUED] ALPRAZolam (XANAX) 0.5 MG tablet TAKE ONE HALF TO 1 TABLET BY MOUTH EVERY 4 HOURS AS NEEDED (Patient not taking: Reported on 03/13/2023)   [DISCONTINUED] valACYclovir (VALTREX) 1000 MG tablet Take by mouth. (Patient not taking: Reported on 06/15/2020)   No facility-administered medications prior to visit.    Review of Systems  Last CBC Lab Results  Component Value Date   WBC 8.6 09/25/2019   HGB 15.0 09/25/2019   HCT 43.8 09/25/2019  MCV 88.1 09/25/2019   MCH 30.2 09/25/2019   RDW 12.4 09/25/2019   PLT 281 09/25/2019   Last metabolic panel Lab Results  Component Value Date   GLUCOSE 109 (H) 09/25/2019   NA 137 09/25/2019   K 3.5 09/25/2019   CL 104 09/25/2019   CO2 23 09/25/2019   BUN 19 09/25/2019   CREATININE 1.09 09/25/2019   GFRNONAA >60 09/25/2019   CALCIUM 8.9 09/25/2019   PROT 7.2 01/13/2017   ALBUMIN 3.8 01/13/2017   BILITOT 0.6 01/13/2017   ALKPHOS 76 01/13/2017   AST 42 (H) 01/13/2017   ALT 48 01/13/2017   ANIONGAP 10 09/25/2019        Objective    BP 131/88 (BP Location: Right Arm, Patient Position: Sitting, Cuff Size: Normal)   Pulse (!) 105   Temp 97.8 F (36.6 C)  (Oral)   Ht 6' (1.829 m)   Wt 227 lb (103 kg)   SpO2 99%   BMI 30.79 kg/m  BP Readings from Last 3 Encounters:  03/20/23 131/88  03/13/23 121/84  06/15/20 130/86   Wt Readings from Last 3 Encounters:  03/20/23 227 lb (103 kg)  03/13/23 224 lb 1.6 oz (101.7 kg)  06/15/20 192 lb 12.8 oz (87.5 kg)          Physical Exam  Physical Exam   ABDOMEN: Tenderness upon palpation in the lower left quadrant. No abdominal distention, no rebound tenderness, no overlying skin changes, normal bowel sounds       No results found for any visits on 03/20/23.  Assessment & Plan     Problem List Items Addressed This Visit   None Visit Diagnoses     Generalized abdominal pain    -  Primary   Relevant Medications   metroNIDAZOLE (FLAGYL) 500 MG tablet   ciprofloxacin (CIPRO) 500 MG tablet   Other Relevant Orders   CBC   Comprehensive metabolic panel   Hx of diverticulitis of colon       Relevant Medications   metroNIDAZOLE (FLAGYL) 500 MG tablet   ciprofloxacin (CIPRO) 500 MG tablet   Other Relevant Orders   CBC   Comprehensive metabolic panel           Diverticulitis Recurrent diverticulitis presenting as left lower quadrant abdominal pain since yesterday. Symptoms include nausea, no vomiting, no diarrhea, and no fever. Physical exam shows left lower quadrant tenderness without signs of perforation or abscess. Early treatment is crucial to prevent complications. Discussed risks of untreated diverticulitis, including abscess and perforation, and benefits of antibiotic therapy. Patient prefers immediate treatment. - acute on chronic  - Order CBC and CMP - Prescribe metronidazole 500 mg TID for 10 days - Prescribe ciprofloxacin 500 mg BID for 10 days - Send prescriptions to [facility] on [employer] - Advise return if no improvement in 10 days  Hypertension Hypertension managed with low-dose medication. Missed dose last night, possibly contributing to elevated pulse  during visit. - Continue current antihypertensive medication as prescribed   Return in about 10 days (around 03/30/2023), or if symptoms worsen or fail to improve, for abd pain .         Ronnald Ramp, MD  [facility] [phone] (phone) [phone] (fax)  [facility]

## 2023-03-21 LAB — COMPREHENSIVE METABOLIC PANEL
ALT: 16 [IU]/L (ref 0–44)
AST: 13 [IU]/L (ref 0–40)
Albumin: 4.4 g/dL (ref 3.8–4.9)
Alkaline Phosphatase: 102 [IU]/L (ref 44–121)
BUN/Creatinine Ratio: 17 (ref 9–20)
BUN: 15 mg/dL (ref 6–24)
Bilirubin Total: 0.4 mg/dL (ref 0.0–1.2)
CO2: 24 mmol/L (ref 20–29)
Calcium: 9.3 mg/dL (ref 8.7–10.2)
Chloride: 104 mmol/L (ref 96–106)
Creatinine, Ser: 0.89 mg/dL (ref 0.76–1.27)
Globulin, Total: 2.8 g/dL (ref 1.5–4.5)
Glucose: 96 mg/dL (ref 70–99)
Potassium: 4.7 mmol/L (ref 3.5–5.2)
Sodium: 144 mmol/L (ref 134–144)
Total Protein: 7.2 g/dL (ref 6.0–8.5)
eGFR: 102 mL/min/{1.73_m2} (ref >59)

## 2023-03-21 LAB — CBC
Hematocrit: 45.5 % (ref 37.5–51.0)
Hemoglobin: 15.1 g/dL (ref 13.0–17.7)
MCH: 29.9 pg (ref 26.6–33.0)
MCHC: 33.2 g/dL (ref 31.5–35.7)
MCV: 90 fL (ref 79–97)
Platelets: 298 10*3/uL (ref 150–450)
RBC: 5.05 x10E6/uL (ref 4.14–5.80)
RDW: 13 % (ref 11.6–15.4)
WBC: 13.5 10*3/uL — ABNORMAL HIGH (ref 3.4–10.8)

## 2023-04-07 ENCOUNTER — Encounter: Payer: Self-pay | Admitting: Family Medicine

## 2023-04-07 ENCOUNTER — Ambulatory Visit (INDEPENDENT_AMBULATORY_CARE_PROVIDER_SITE_OTHER): Payer: BC Managed Care – PPO | Admitting: Family Medicine

## 2023-04-07 VITALS — BP 139/92 | HR 101 | Ht 72.0 in | Wt 219.5 lb

## 2023-04-07 DIAGNOSIS — F5102 Adjustment insomnia: Secondary | ICD-10-CM

## 2023-04-07 DIAGNOSIS — F439 Reaction to severe stress, unspecified: Secondary | ICD-10-CM | POA: Diagnosis not present

## 2023-04-07 MED ORDER — METOPROLOL SUCCINATE ER 25 MG PO TB24: 25.0000 mg | ORAL_TABLET | Freq: Every day | ORAL | 1 refills | Status: DC

## 2023-04-07 MED ORDER — TRAZODONE HCL 100 MG PO TABS: 100.0000 mg | ORAL_TABLET | Freq: Every evening | ORAL | 3 refills | Status: DC | PRN

## 2023-04-07 MED ORDER — BUSPIRONE HCL 5 MG PO TABS: ORAL_TABLET | ORAL | 1 refills | Status: DC

## 2023-04-07 NOTE — Progress Notes (Signed)
Established patient visit   Patient: Patrick Lucas   DOB: 1970-09-21   52 y.o. Male  MRN: [id number] Visit Date: 04/07/2023  Today's healthcare provider: Mila Merry, MD   Chief Complaint  Patient presents with   Medical Management of Chronic Issues    3 week follow-up   Anxiety    Pt was given provided lorazepam rx for one month for sleep for short term use. Patient reports it doesn't seem like its working too good.    Hypertension    Losartan 10 mg    Subjective    Discussed the use of AI scribe software for clinical note transcription with the patient, who gave verbal consent to proceed.  History of Present Illness   The patient, with a history of anxiety and insomnia, recently released from prison, presents with ongoing struggles with anxiety and difficulty finding employment due to his felony record. He reports feeling anxious and nervous in most situations, which has been exacerbated by his current unemployment status. He has tried to manage his anxiety with lorazepam, a medication he has taken in the past, but reports that it no longer seems effective. 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  The patient has also been experiencing sleep disturbances, a long-standing issue for him. He has previously taken trazodone for sleep, in combination with lorazepam, and reports no adverse effects from this medication. He has also been prescribed blood pressure medication, which he has been taking without any reported side effects.  In the past, the patient has tried other medications for anxiety, including Effexor and escitalopram, but did not find them effective and experienced undesirable side effects. He has not previously taken buspirone or beta blockers for his anxiety. He states he was prescribed lisinopril 10mg  while in prison for his blood pressure. He has not  previously engaged in counseling for his anxiety and stress.       Medications: Outpatient Medications Prior to Visit  Medication Sig   LORazepam (ATIVAN) 1 MG tablet Take 1 tablet (1 mg total) by mouth at bedtime as needed for sleep.   No facility-administered medications prior to visit.   Review of Systems     Objective    BP (!) 139/92   Pulse (!) 101   Ht 6' (1.829 m)   Wt 219 lb 8 oz (99.6 kg)   SpO2 98%   BMI 29.77 kg/m   Physical Exam   General: Appearance:    Well developed, well nourished male in no acute distress  Eyes:    PERRL, conjunctiva/corneas clear, EOM's intact       Lungs:     Clear to auscultation bilaterally, respirations unlabored  Heart:    Tachycardic. Normal rhythm. No murmurs, rubs, or gallops.    MS:   All extremities are intact.    Neurologic:   Awake, alert, oriented x 3. No apparent focal neurological defect.         Assessment & Plan       Anxiety Reports of increased anxiety and nervousness. Lorazepam provided minimal relief. Difficulty with sleep. Elevated heart rate noted during attempts to donate plasma. -Start Buspirone 5mg , titrate up slowly as tolerated to avoid dizziness. -Continue Trazodone for sleep. -Consider use of Lorazepam sparingly, 1-2 times per week as needed.  Hypertension Reports of high blood pressure readings. Was on lisinopril in prison, but  not taken recently.  -Consider switching to Metoprolol, a beta blocker, to manage both hypertension and physical symptoms of anxiety.  Social Stressors Recently released from prison, struggling with job search due to felony record. -Referral to counseling services for additional support.  Follow-up in 1 month to assess response to medication changes and overall health status.         Mila Merry, MD  [facility] [phone] (phone) [phone] (fax)  [facility]

## 2023-04-07 NOTE — Patient Instructions (Signed)
.   Please review the attached list of medications and notify my office if there are any errors.   . Please bring all of your medications to every appointment so we can make sure that our medication list is the same as yours.   

## 2023-04-26 ENCOUNTER — Other Ambulatory Visit: Payer: Self-pay | Admitting: Family Medicine

## 2023-05-09 ENCOUNTER — Ambulatory Visit (INDEPENDENT_AMBULATORY_CARE_PROVIDER_SITE_OTHER): Payer: BC Managed Care – PPO | Admitting: Family Medicine

## 2023-05-09 VITALS — BP 136/88 | HR 89 | Resp 16 | Ht 72.0 in | Wt 225.4 lb

## 2023-05-09 DIAGNOSIS — F5102 Adjustment insomnia: Secondary | ICD-10-CM

## 2023-05-09 DIAGNOSIS — F439 Reaction to severe stress, unspecified: Secondary | ICD-10-CM | POA: Diagnosis not present

## 2023-05-09 MED ORDER — LORAZEPAM 1 MG PO TABS: 1.0000 mg | ORAL_TABLET | Freq: Every evening | ORAL | 3 refills | Status: DC | PRN

## 2023-05-09 MED ORDER — BUSPIRONE HCL 10 MG PO TABS: 10.0000 mg | ORAL_TABLET | Freq: Two times a day (BID) | ORAL | 3 refills | Status: DC

## 2023-05-09 NOTE — Progress Notes (Signed)
      Established patient visit   Patient: Patrick Lucas   DOB: 05-25-1970   53 y.o. Male  MRN: [id number] Visit Date: 05/09/2023  Today's healthcare provider: Nancyann Perry, MD   Chief Complaint  Patient presents with   Medication Management   Subjective    HPI  Follow up insomnia and situational stress since stared on buspirone  and trazodone  in last month. Has been taking 1 trazodone  and 1 lorazepam  at bedtime and sleeping much better. Feels that buspirone  is working for stress. Has found a job which he starts tomorrow.   Medications: Outpatient Medications Prior to Visit  Medication Sig   metoprolol  succinate (TOPROL -XL) 25 MG 24 hr tablet Take 1 tablet (25 mg total) by mouth daily.   traZODone  (DESYREL ) 100 MG tablet Take 1 tablet (100 mg total) by mouth at bedtime as needed for sleep.   [DISCONTINUED] busPIRone  (BUSPAR ) 5 MG tablet Take one every evening for 4 das, then 1 twice a day 4 days, then 1 in the morning and 2 at night for 4 days, then 2 twice a day   [DISCONTINUED] LORazepam  (ATIVAN ) 1 MG tablet Take 1 tablet (1 mg total) by mouth at bedtime as needed for sleep.   No facility-administered medications prior to visit.    Review of Systems  Constitutional:  Negative for appetite change, chills and fever.  Respiratory:  Negative for chest tightness, shortness of breath and wheezing.   Cardiovascular:  Negative for chest pain and palpitations.  Gastrointestinal:  Negative for abdominal pain, nausea and vomiting.       Objective    BP 136/88   Pulse 89   Resp 16   Ht 6' (1.829 m)   Wt 225 lb 6.4 oz (102.2 kg)   SpO2 99%   BMI 30.57 kg/m    Physical Exam  General appearance: Mildly obese male, cooperative and in no acute distress Head: Normocephalic, without obvious abnormality, atraumatic Respiratory: Respirations even and unlabored, normal respiratory rate Extremities: All extremities are intact.  Skin: Skin color, texture, turgor normal. No rashes  seen  Psych: Appropriate mood and affect. Neurologic: Mental status: Alert, oriented to person, place, and time, thought content appropriate.   Assessment & Plan     1. Situational stress (Primary) Much improved since last month. Would like to continue buspirone . Has gotten hired for a new job which is a relief.   - busPIRone  (BUSPAR ) 10 MG tablet; Take 1 tablet (10 mg total) by mouth 2 (two) times daily.  Dispense: 60 tablet; Refill: 3   2. Adjustment insomnia Much better with improvement in stress level and combination of lorazepam  and trazodone .   Continue current medications.  Follow up 3 months.          Nancyann Perry, MD  [facility] [phone] (phone) [phone] (fax)  [facility]

## 2023-05-09 NOTE — Patient Instructions (Signed)
 [name]  Please review the attached list of medications and notify my office if there are any errors.   . Please bring all of your medications to every appointment so we can make sure that our medication list is the same as yours.

## 2023-06-09 ENCOUNTER — Other Ambulatory Visit: Payer: Self-pay | Admitting: Family Medicine

## 2023-08-08 ENCOUNTER — Ambulatory Visit: Payer: Self-pay | Admitting: Family Medicine

## 2023-09-01 ENCOUNTER — Other Ambulatory Visit: Payer: Self-pay | Admitting: Family Medicine

## 2023-12-15 ENCOUNTER — Telehealth: Payer: Self-pay | Admitting: Family Medicine

## 2023-12-15 DIAGNOSIS — F5102 Adjustment insomnia: Secondary | ICD-10-CM

## 2023-12-16 ENCOUNTER — Other Ambulatory Visit: Payer: Self-pay

## 2023-12-16 DIAGNOSIS — F5102 Adjustment insomnia: Secondary | ICD-10-CM

## 2023-12-16 NOTE — Telephone Encounter (Signed)
 Patient is overdue for follow up appointment and needs to schedule before refill approved.

## 2023-12-16 NOTE — Telephone Encounter (Signed)
 Refusal sent to pharmacy-Patient needs appointment

## 2023-12-30 ENCOUNTER — Telehealth: Payer: Self-pay | Admitting: Family Medicine

## 2023-12-30 DIAGNOSIS — F5102 Adjustment insomnia: Secondary | ICD-10-CM

## 2023-12-30 NOTE — Telephone Encounter (Signed)
 Please advise patient he is overdue for follow up office visit and needs to schedule appointment before rf can be approved.

## 2023-12-31 NOTE — Progress Notes (Unsigned)
 Opened in error

## 2023-12-31 NOTE — Telephone Encounter (Deleted)
 This patient's mychart account is listed as inactive. I don't think he is going to get the message that was sent.

## 2024-01-04 NOTE — Progress Notes (Unsigned)
 Established patient visit  Patient: Patrick Lucas   DOB: [birth year]   53 y.o. Male  MRN: [id number] Visit Date: 01/05/2024  Today's healthcare provider: Jolynn Spencer, PA-C   No chief complaint on file.  Subjective       Discussed the use of AI scribe software for clinical note transcription with the patient, who gave verbal consent to proceed.  History of Present Illness Migel Hannis is a 53 year old male with anxiety, insomnia, and hypertension who presents for medication refills.  He has been out of metoprolol  for a few days and has not taken lorazepam  since May 2025. He feels jittery and nervous, experiencing significant stress at home. He has tried various medications for sleep, including trazodone  and Ambien, with limited success. Trazodone  causes vivid dreams, and while lorazepam  was effective, he has not used it recently. Ambien was also tried without benefit. He denies recent alcohol consumption.       05/09/2023   10:35 AM 04/07/2023    3:22 PM 03/20/2023    2:01 PM  Depression screen PHQ 2/9  Decreased Interest 1 1 2   Down, Depressed, Hopeless 1 1 1   PHQ - 2 Score 2 2 3   Altered sleeping 3 2 2   Tired, decreased energy 1 1 1   Change in appetite 0 0 0  Feeling bad or failure about yourself  1 1 1   Trouble concentrating 1 1 0  Moving slowly or fidgety/restless 0 0 0  Suicidal thoughts 0 0 0  PHQ-9 Score 8 7 7   Difficult doing work/chores Somewhat difficult Somewhat difficult Somewhat difficult      05/09/2023   10:35 AM 04/07/2023    3:23 PM 03/20/2023    2:02 PM 03/13/2023   10:06 AM  GAD 7 : Generalized Anxiety Score  Nervous, Anxious, on Edge 1 3 2 2   Control/stop worrying 1 2 1 3   Worry too much - different things 1 2 1 2   Trouble relaxing 1 2 1 2   Restless 1 2 1 2   Easily annoyed or irritable 0 1 0 0  Afraid - awful might happen 0 1 0 1  Total GAD 7 Score 5 13 6 12   Anxiety Difficulty Somewhat difficult Very difficult Somewhat difficult  Somewhat difficult    Medications: Outpatient Medications Prior to Visit  Medication Sig   busPIRone  (BUSPAR ) 10 MG tablet Take 1 tablet (10 mg total) by mouth 2 (two) times daily.   LORazepam  (ATIVAN ) 1 MG tablet Take 1 tablet (1 mg total) by mouth at bedtime as needed for sleep.   metoprolol  succinate (TOPROL -XL) 25 MG 24 hr tablet TAKE 1 TABLET(25 MG) BY MOUTH DAILY   traZODone  (DESYREL ) 100 MG tablet Take 1 tablet (100 mg total) by mouth at bedtime as needed for sleep.   No facility-administered medications prior to visit.    Review of Systems  All other systems reviewed and are negative.  All negative Except see HPI   {Insert previous labs (optional):23779} {See past labs  Heme  Chem  Endocrine  Serology  Results Review (optional):1}   Objective    There were no vitals taken for this visit. {Insert last BP/Wt (optional):23777}{See vitals history (optional):1}   Physical Exam Vitals reviewed.  Constitutional:      General: He is not in acute distress.    Appearance: Normal appearance. He is not diaphoretic.  HENT:     Head: Normocephalic and atraumatic.  Eyes:     General: No scleral icterus.  Conjunctiva/sclera: Conjunctivae normal.  Cardiovascular:     Rate and Rhythm: Normal rate and regular rhythm.     Pulses: Normal pulses.     Heart sounds: Normal heart sounds. No murmur heard. Pulmonary:     Effort: Pulmonary effort is normal. No respiratory distress.     Breath sounds: Normal breath sounds. No wheezing or rhonchi.  Musculoskeletal:     Cervical back: Neck supple.     Right lower leg: No edema.     Left lower leg: No edema.  Lymphadenopathy:     Cervical: No cervical adenopathy.  Skin:    General: Skin is warm and dry.     Findings: No rash.  Neurological:     Mental Status: He is alert and oriented to person, place, and time. Mental status is at baseline.  Psychiatric:        Mood and Affect: Mood normal.        Behavior: Behavior normal.       No results found for any visits on 01/05/24.      Assessment & Plan Insomnia Chronic insomnia with difficulty finding effective medication. Trazodone  caused vivid dreams. Lorazepam  was effective but not used for three months. Multiple medications including Ambien were unsuccessful. - Prescribe trazodone  temporarily for sleep. - Discuss potential side effects of trazodone , including vivid dreams.  Anxiety disorder Chronic anxiety with jitteriness and nervousness. Buspirone  prescribed. He prefers not to use lorazepam  and Xanax  due to side effects. Declined psychiatry referral. - Prescribe buspirone  for anxiety management. - Discuss the importance of taking buspirone  to manage anxiety. - Advise on the risks of combining alcohol with lorazepam  and trazodone .  Essential hypertension Chronic and stable Hypertension managed with metoprolol . He has been out of metoprolol  for a few days, potentially affecting blood pressure control. - Prescribe metoprolol  for blood pressure control. Advised lifestyle modifications Will follow-up  Follow-Up Follow-up with primary care provider to reassess medication regimen and anxiety management. - Schedule follow-up appointment with Doctor Gasper in one month.  Situational stress (Primary)  - busPIRone  (BUSPAR ) 10 MG tablet; Take 1 tablet (10 mg total) by mouth 2 (two) times daily.  Dispense: 60 tablet; Refill: 3  Adjustment insomnia  - traZODone  (DESYREL ) 100 MG tablet; Take 1 tablet (100 mg total) by mouth at bedtime as needed for sleep.  Dispense: 30 tablet; Refill: 3  Alcohol use disorder, severe, in sustained remission (HCC) Will monitor Pt was advised on interactions between his current meds and alcohol  Primary hypertension  - metoprolol  succinate (TOPROL -XL) 25 MG 24 hr tablet; Take 1 tablet (25 mg total) by mouth daily.  Dispense: 90 tablet; Refill: 0   No orders of the defined types were placed in this encounter.   No  follow-ups on file.   The patient was advised to call back or seek an in-person evaluation if the symptoms worsen or if the condition fails to improve as anticipated.  I discussed the assessment and treatment plan with the patient. The patient was provided an opportunity to ask questions and all were answered. The patient agreed with the plan and demonstrated an understanding of the instructions.  I, Caspar Favila, PA-C have reviewed all documentation for this visit. The documentation on 01/05/2024  for the exam, diagnosis, procedures, and orders are all accurate and complete.  Jolynn Spencer, [facility], MMS [facility] [phone] (phone) [phone] (fax)  [facility]

## 2024-01-05 ENCOUNTER — Ambulatory Visit (INDEPENDENT_AMBULATORY_CARE_PROVIDER_SITE_OTHER): Admitting: Physician Assistant

## 2024-01-05 ENCOUNTER — Encounter: Payer: Self-pay | Admitting: Physician Assistant

## 2024-01-05 VITALS — BP 114/80 | HR 98 | Resp 16 | Ht 72.0 in | Wt 206.0 lb

## 2024-01-05 DIAGNOSIS — F439 Reaction to severe stress, unspecified: Secondary | ICD-10-CM | POA: Insufficient documentation

## 2024-01-05 DIAGNOSIS — G4700 Insomnia, unspecified: Secondary | ICD-10-CM | POA: Insufficient documentation

## 2024-01-05 DIAGNOSIS — F1021 Alcohol dependence, in remission: Secondary | ICD-10-CM | POA: Diagnosis not present

## 2024-01-05 DIAGNOSIS — I10 Essential (primary) hypertension: Secondary | ICD-10-CM

## 2024-01-05 DIAGNOSIS — F5102 Adjustment insomnia: Secondary | ICD-10-CM

## 2024-01-05 DIAGNOSIS — F17200 Nicotine dependence, unspecified, uncomplicated: Secondary | ICD-10-CM

## 2024-01-05 DIAGNOSIS — F322 Major depressive disorder, single episode, severe without psychotic features: Secondary | ICD-10-CM

## 2024-01-05 DIAGNOSIS — R1084 Generalized abdominal pain: Secondary | ICD-10-CM

## 2024-01-05 MED ORDER — METOPROLOL SUCCINATE ER 25 MG PO TB24: 25.0000 mg | ORAL_TABLET | Freq: Every day | ORAL | 0 refills | Status: DC

## 2024-01-05 MED ORDER — BUSPIRONE HCL 10 MG PO TABS: 10.0000 mg | ORAL_TABLET | Freq: Two times a day (BID) | ORAL | 3 refills | Status: DC

## 2024-01-05 MED ORDER — TRAZODONE HCL 100 MG PO TABS: 100.0000 mg | ORAL_TABLET | Freq: Every evening | ORAL | 3 refills | Status: DC | PRN

## 2024-01-07 ENCOUNTER — Ambulatory Visit: Admitting: Family Medicine

## 2024-02-02 ENCOUNTER — Encounter: Payer: Self-pay | Admitting: Family Medicine

## 2024-02-02 ENCOUNTER — Ambulatory Visit (INDEPENDENT_AMBULATORY_CARE_PROVIDER_SITE_OTHER): Admitting: Family Medicine

## 2024-02-02 VITALS — BP 132/92 | HR 101 | Wt 207.0 lb

## 2024-02-02 DIAGNOSIS — F5102 Adjustment insomnia: Secondary | ICD-10-CM | POA: Diagnosis not present

## 2024-02-02 DIAGNOSIS — I10 Essential (primary) hypertension: Secondary | ICD-10-CM

## 2024-02-02 DIAGNOSIS — F439 Reaction to severe stress, unspecified: Secondary | ICD-10-CM | POA: Diagnosis not present

## 2024-02-02 MED ORDER — BUSPIRONE HCL 10 MG PO TABS: 10.0000 mg | ORAL_TABLET | Freq: Two times a day (BID) | ORAL | 3 refills | Status: AC

## 2024-02-02 MED ORDER — LORAZEPAM 1 MG PO TABS: 1.0000 mg | ORAL_TABLET | Freq: Every evening | ORAL | 3 refills | Status: AC | PRN

## 2024-02-02 MED ORDER — METOPROLOL SUCCINATE ER 25 MG PO TB24: 25.0000 mg | ORAL_TABLET | Freq: Every day | ORAL | 3 refills | Status: AC

## 2024-02-02 MED ORDER — TRAZODONE HCL 100 MG PO TABS: 100.0000 mg | ORAL_TABLET | Freq: Every evening | ORAL | 2 refills | Status: AC | PRN

## 2024-02-02 NOTE — Patient Instructions (Signed)
 Patrick Lucas  Please review the attached list of medications and notify my office if there are any errors.   . Please bring all of your medications to every appointment so we can make sure that our medication list is the same as yours.

## 2024-02-02 NOTE — Progress Notes (Signed)
 Established patient visit   Patient: Patrick Lucas   DOB: [birth year]   53 y.o. Male  MRN: [id number] Visit Date: 02/02/2024  Today's healthcare provider: Nancyann Perry, MD   Chief Complaint  Patient presents with   Medical Management of Chronic Issues   Medication Refill   Subjective    Discussed the use of AI scribe software for clinical note transcription with the patient, who gave verbal consent to proceed.  History of Present Illness   Patrick Lucas is a 53 year old male with hypertension who presents for medication follow-up and blood pressure management.  He was prescribed metoprolol  for blood pressure management about a month ago and has not experienced any issues with the medication. He forgot to take his dose this morning. 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 His heart rate usually runs in the nineties.  He is also taking buspirone  for anxiety, which he feels is somewhat effective, though he still experiences issues. He takes lorazepam  as needed, primarily avoiding taking it with trazodone  due to experiencing vivid and 'far out' dreams when taking trazodone . He uses trazodone  for sleep but sometimes substitutes it with lorazepam  if needed.  He has recently started a part-time job and is doing [employer] work, which is going well. He is planning to visit his family in [place] for a week, whom he hasn't seen in almost three years.       Medications: Outpatient Medications Prior to Visit  Medication Sig   busPIRone  (BUSPAR ) 10 MG tablet Take 1 tablet (10 mg total) by mouth 2 (two) times daily.   LORazepam  (ATIVAN ) 1 MG tablet Take 1 tablet (1 mg total) by mouth at bedtime as needed for sleep.   metoprolol  succinate (TOPROL -XL) 25 MG 24 hr tablet Take 1 tablet (25 mg total) by mouth daily.   traZODone  (DESYREL ) 100 MG tablet Take 1 tablet (100 mg total) by mouth at bedtime as needed for sleep.    No facility-administered medications prior to visit.   Review of Systems  Constitutional:  Negative for appetite change, chills and fever.  Respiratory:  Negative for chest tightness, shortness of breath and wheezing.   Cardiovascular:  Negative for chest pain and palpitations.  Gastrointestinal:  Negative for abdominal pain, nausea and vomiting.       Objective    BP (!) 132/92   Pulse (!) 101   Wt 207 lb (93.9 kg)   SpO2 99%   BMI 28.07 kg/m   Physical Exam   General: Appearance:    Well developed, well nourished male in no acute distress  Eyes:    PERRL, conjunctiva/corneas clear, EOM's intact       Lungs:     Clear to auscultation bilaterally, respirations unlabored  Heart:    Tachycardic. Normal rhythm. No murmurs, rubs, or gallops.    MS:   All extremities are intact.    Neurologic:   Awake, alert, oriented x 3. No apparent focal neurological defect.          Assessment & Plan    1. Primary hypertension (Primary) Fairly well controlled on current dose of  metoprolol  succinate (TOPROL -XL) 25 MG 24 hr tablet; Take 1 tablet (25 mg total) by mouth daily.  Dispense: 90 tablet; Refill: 3  2. Situational stress continue - busPIRone  (BUSPAR ) 10 MG tablet; Take 1 tablet (10 mg total) by mouth 2 (two) times  daily.  Dispense: 180 tablet; Refill: 3  3. Adjustment insomnia refill- LORazepam  (ATIVAN ) 1 MG tablet; Take 1 tablet (1 mg total) by mouth at bedtime as needed for sleep.  Dispense: 30 tablet; Refill: 3 - traZODone  (DESYREL ) 100 MG tablet; Take 1 tablet (100 mg total) by mouth at bedtime as needed for sleep.  Dispense: 90 tablet; Refill: 2  Return in about 6 months (around 08/02/2024).     Nancyann Perry, MD  [facility] [phone] (phone) [phone] (fax)  [facility]

## 2024-02-06 ENCOUNTER — Ambulatory Visit: Admitting: Family Medicine

## 2024-04-14 ENCOUNTER — Telehealth: Payer: Self-pay

## 2024-04-14 NOTE — Telephone Encounter (Signed)
 Call pt re plasma Hep B results from 03/31/24 specimen from plasma: 04/05/24 HBsAg neutralization positive   No previous testing found in Epic. No previous events found in [facility] EDSS.   Pt did have recent Hep B immunization: #1- 03/28/24    Hep B immunization 3 days before plasma specimen drawn.   -------------------------------   Inquire about symptoms.   When testing was at plasma center guidance =  Should be confirmed by testing through their  own provider or the local health department Follow-up testing 1-3 month after initial testing could reveal an acute infection.   After vaccination guidance= Retest 60 days after the date of last vaccine.

## 2024-04-14 NOTE — Telephone Encounter (Signed)
 Phone call to pt at [phone]. Pt answered and confirmed identity. Pt had already been given results from plasma facility. Pt stated [facility] gave him a  Hep B vaccine a few days before he gave plasma. Pt plans to follow-up with PCP, he has medicaid. If any issues with getting additional testing done at PCP, will call [facility] for appt at end of Jan 2026. Stated he had been donating plasma for many years.

## 2024-08-02 ENCOUNTER — Ambulatory Visit: Admitting: Family Medicine
